# Patient Record
Sex: Female | Born: 1980 | Hispanic: Yes | Marital: Single | Smoking: Never smoker
Health system: Southern US, Community
[De-identification: ages and names within clinical notes are randomized; demographics above are authoritative.]

## PROBLEM LIST (undated history)

## (undated) ENCOUNTER — Inpatient Hospital Stay: Payer: Self-pay

## (undated) DIAGNOSIS — Z789 Other specified health status: Secondary | ICD-10-CM

## (undated) HISTORY — PX: NO PAST SURGERIES: SHX2092

---

## 2015-08-26 ENCOUNTER — Other Ambulatory Visit: Payer: Self-pay | Admitting: Family Medicine

## 2015-08-26 DIAGNOSIS — O200 Threatened abortion: Secondary | ICD-10-CM

## 2015-08-26 LAB — OB RESULTS CONSOLE HEPATITIS B SURFACE ANTIGEN: HEP B S AG: NEGATIVE

## 2015-08-26 LAB — OB RESULTS CONSOLE RPR: RPR: NONREACTIVE

## 2015-08-26 LAB — OB RESULTS CONSOLE HIV ANTIBODY (ROUTINE TESTING): HIV: NONREACTIVE

## 2015-08-28 ENCOUNTER — Ambulatory Visit
Admission: RE | Admit: 2015-08-28 | Discharge: 2015-08-28 | Disposition: A | Discharge status: Home / Self Care | Payer: Medicaid Other | Source: Ambulatory Visit | Attending: Family Medicine | Admitting: Family Medicine

## 2015-08-28 DIAGNOSIS — Z3A10 10 weeks gestation of pregnancy: Secondary | ICD-10-CM | POA: Insufficient documentation

## 2015-08-28 DIAGNOSIS — O200 Threatened abortion: Secondary | ICD-10-CM | POA: Insufficient documentation

## 2015-08-28 DIAGNOSIS — O208 Other hemorrhage in early pregnancy: Secondary | ICD-10-CM | POA: Insufficient documentation

## 2015-09-02 ENCOUNTER — Emergency Department: Payer: Medicaid Other

## 2015-09-02 ENCOUNTER — Emergency Department
Admission: EM | Admit: 2015-09-02 | Discharge: 2015-09-02 | Disposition: A | Discharge status: Home / Self Care | Payer: Medicaid Other | Attending: Emergency Medicine | Admitting: Emergency Medicine

## 2015-09-02 ENCOUNTER — Encounter: Payer: Self-pay | Admitting: Emergency Medicine

## 2015-09-02 DIAGNOSIS — Z3A11 11 weeks gestation of pregnancy: Secondary | ICD-10-CM | POA: Diagnosis not present

## 2015-09-02 DIAGNOSIS — Z87440 Personal history of urinary (tract) infections: Secondary | ICD-10-CM | POA: Insufficient documentation

## 2015-09-02 DIAGNOSIS — O209 Hemorrhage in early pregnancy, unspecified: Secondary | ICD-10-CM | POA: Insufficient documentation

## 2015-09-02 LAB — URINALYSIS COMPLETE WITH MICROSCOPIC (ARMC ONLY)
Bilirubin Urine: NEGATIVE
Glucose, UA: NEGATIVE mg/dL
LEUKOCYTES UA: NEGATIVE
Nitrite: NEGATIVE
PH: 7 (ref 5.0–8.0)
PROTEIN: NEGATIVE mg/dL
SPECIFIC GRAVITY, URINE: 1.004 — AB (ref 1.005–1.030)

## 2015-09-02 LAB — CBC WITH DIFFERENTIAL/PLATELET
BASOS ABS: 0 10*3/uL (ref 0–0.1)
BASOS PCT: 0 %
EOS PCT: 1 %
Eosinophils Absolute: 0.1 10*3/uL (ref 0–0.7)
HEMATOCRIT: 37.7 % (ref 35.0–47.0)
HEMOGLOBIN: 13.4 g/dL (ref 12.0–16.0)
Lymphocytes Relative: 21 %
Lymphs Abs: 1.7 10*3/uL (ref 1.0–3.6)
MCH: 30.8 pg (ref 26.0–34.0)
MCHC: 35.6 g/dL (ref 32.0–36.0)
MCV: 86.5 fL (ref 80.0–100.0)
MONO ABS: 0.5 10*3/uL (ref 0.2–0.9)
Monocytes Relative: 6 %
NEUTROS ABS: 5.8 10*3/uL (ref 1.4–6.5)
Neutrophils Relative %: 72 %
Platelets: 255 10*3/uL (ref 150–440)
RBC: 4.35 MIL/uL (ref 3.80–5.20)
RDW: 13.2 % (ref 11.5–14.5)
WBC: 8.2 10*3/uL (ref 3.6–11.0)

## 2015-09-02 LAB — BASIC METABOLIC PANEL
ANION GAP: 7 (ref 5–15)
BUN: 7 mg/dL (ref 6–20)
CALCIUM: 9 mg/dL (ref 8.9–10.3)
CO2: 23 mmol/L (ref 22–32)
Chloride: 105 mmol/L (ref 101–111)
Creatinine, Ser: 0.38 mg/dL — ABNORMAL LOW (ref 0.44–1.00)
GFR calc Af Amer: >60 mL/min (ref >60)
GLUCOSE: 90 mg/dL (ref 65–99)
POTASSIUM: 3.8 mmol/L (ref 3.5–5.1)
SODIUM: 135 mmol/L (ref 135–145)

## 2015-09-02 LAB — CHLAMYDIA/NGC RT PCR (ARMC ONLY)

## 2015-09-02 LAB — WET PREP, GENITAL
Clue Cells Wet Prep HPF POC: NONE SEEN
Sperm: NONE SEEN
TRICH WET PREP: NONE SEEN
YEAST WET PREP: NONE SEEN

## 2015-09-02 LAB — HCG, QUANTITATIVE, PREGNANCY: hCG, Beta Chain, Quant, S: 108900 m[IU]/mL — ABNORMAL HIGH (ref <5)

## 2015-09-02 LAB — ABO/RH: ABO/RH(D): A POS

## 2015-09-02 NOTE — ED Notes (Signed)
Son and daughter are sitting in room - pt is in [place]

## 2015-09-02 NOTE — ED Notes (Signed)
Pt to [place] at this time.

## 2015-09-02 NOTE — Discharge Instructions (Signed)
Return to the emergency department for any worsening bleeding, dizziness or passing out, worsening pain, or any other symptoms concerning to you.   Hemorragia vaginal durante el embarazo (primer trimestre) (Vaginal Bleeding During Pregnancy, First Trimester) Durante los primeros meses del embarazo es relativamente frecuente que se presente una pequea hemorragia (manchas). Esta situacin generalmente mejora por s misma. Estas hemorragias o manchas tienen diversas causas al inicio del embarazo. Algunas manchas pueden estar relacionadas al Big Lotsembarazo y otras no. En la International Business Machinesmayora de los casos, la hemorragia es normal y no es un problema. Sin embargo, la hemorragia tambin puede ser un signo de algo grave. Debe informar a su mdico de inmediato si tiene alguna hemorragia vaginal. Algunas causas posibles de hemorragia vaginal durante el primer trimestre incluyen:  Infeccin o inflamacin del cuello del tero.  Crecimientos anormales (plipos) en el cuello del tero.  Aborto espontneo o amenaza de aborto espontneo.  Tejido del Psychiatristembarazo se ha desarrollado fuera del tero y en una trompa de falopio (embarazo ectpico).  Se han desarrollado pequeos quistes en el tero en lugar de tejido de embarazo (embarazo molar). INSTRUCCIONES PARA EL CUIDADO EN EL HOGAR  Controle su afeccin para ver si hay cambios. Las siguientes indicaciones ayudarn a Psychologist, educationalaliviar cualquier Longs Drug Storesmolestia que pueda sentir:  Siga las indicaciones del mdico para restringir su actividad. Si el mdico le indica descanso en la cama, debe quedarse en la cama y levantarse solo para ir al bao. No obstante, el mdico puede permitirle que continu con tareas livianas.  Si es necesario, organcese para que alguien le ayude con las actividades y responsabilidades cotidianas mientras est en cama.  Lleve un registro de la cantidad y la saturacin de las toallas higinicas que Landscape architectutiliza cada da. Anote este dato.  No use tampones.No se haga duchas  vaginales.  No tenga relaciones sexuales u orgasmos hasta que el mdico la autorice.  Si elimina tejido por la vagina, gurdelo para mostrrselo al American Expressmdico.  [place]ome solo medicamentos de venta libre o recetados, segn las indicaciones del mdico.  No tome aspirina, ya que puede causar hemorragias.  Cumpla con todas las visitas de control, segn le indique su mdico. SOLICITE ATENCIN MDICA SI:  Tiene un sangrado vaginal en cualquier momento del embarazo.  Tiene calambres o dolores de [place] Valleyparto.  Tiene fiebre que los medicamentos no Sports coachpueden controlar. SOLICITE ATENCIN MDICA DE INMEDIATO SI:   Siente calambres intensos en la espalda o en el vientre (abdomen).  Elimina cogulos grandes o tejido por la vagina.  La hemorragia aumenta.  Si se siente mareada, dbil o se desmaya.  Tiene escalofros.  Tiene una prdida importante o sale lquido a borbotones por la vagina.  Se desmaya mientras defeca. ASEGRESE DE QUE:  Comprende estas instrucciones.  Controlar su afeccin.  Recibir ayuda de inmediato si no mejora o si empeora.   Esta informacin no tiene Theme park managercomo fin reemplazar el consejo del mdico. Asegrese de hacerle al mdico cualquier pregunta que tenga.   Document Released: 11/26/2004 Document Revised: 02/21/2013 Elsevier Interactive Patient Education [employer]2016 Elsevier Inc.

## 2015-09-02 NOTE — ED Provider Notes (Signed)
[facility] Emergency Department Provider Note   ____________________________________________  Time seen:  I have reviewed the triage vital signs and the triage nursing note.  HISTORY  Chief Complaint Vaginal Bleeding   Historian Patient through spanish interpreter Spouse speaks in english  HPI Autumn Hunter is a 35 y.o. female G2P1 who is approximately 10-[redacted] weeks pregnant and is here for evaluation of small amount of bright red blood on the toilet paper on Friday when they were out of town. She's had brownish mucus vaginal discharge over the last few days.  Some mild superpubic cramping today without specific dysuria. Several weeks ago she was treated for urinary tract infection. No fevers. No nausea. No vomiting.    History reviewed. No pertinent past medical history. Prior UTI  There are no active problems to display for this patient.   History reviewed. No pertinent past surgical history.  No current outpatient prescriptions on file.  Allergies Review of patient's allergies indicates no known allergies.  No family history on file.  Social History Social History  Substance Use Topics  . Smoking status: Never Smoker   . Smokeless tobacco: None  . Alcohol Use: No    Review of Systems  Constitutional: Negative for fever. Eyes: Negative for visual changes. ENT: Negative for sore throat. Cardiovascular: Negative for chest pain. Respiratory: Negative for shortness of breath. Gastrointestinal: Negative for  vomiting and diarrhea. Genitourinary: Negative for dysuria. Musculoskeletal: Negative for back pain. Skin: Negative for rash. Neurological: Negative for headache. 10 point Review of Systems otherwise negative ____________________________________________   PHYSICAL EXAM:  VITAL SIGNS: ED Triage Vitals  Enc Vitals Group     BP 09/02/15 1049 107/62 mmHg     Pulse Rate 09/02/15 1049 64     Resp 09/02/15 1049 20     Temp 09/02/15  1049 98.6 F (37 C)     Temp Source 09/02/15 1049 Oral     SpO2 09/02/15 1049 100 %     Weight 09/02/15 1049 137 lb (62.143 kg)     Height 09/02/15 1049 5\' 4"  (1.626 m)     Head Cir --      Peak Flow --      Pain Score 09/02/15 1049 5     Pain Loc --      Pain Edu? --      Excl. in GC? --      Constitutional: Alert and oriented. Well appearing and in no distress. HEENT   Head: Normocephalic and atraumatic.      Eyes: Conjunctivae are normal. PERRL. Normal extraocular movements.      Ears:         Nose: No congestion/rhinnorhea.   Mouth/Throat: Mucous membranes are moist.   Neck: No stridor. Cardiovascular/Chest: Normal rate, regular rhythm.  No murmurs, rubs, or gallops. Respiratory: Normal respiratory effort without tachypnea nor retractions. Breath sounds are clear and equal bilaterally. No wheezes/rales/rhonchi. Gastrointestinal: Soft. No distention, no guarding, no rebound. Nontender.   Genitourinary/rectal: Small amount of brownish discharge. No cervical motion tenderness. No bright red blood or clots. Musculoskeletal: Nontender with normal range of motion in all extremities. No joint effusions.  No lower extremity tenderness.  No edema. Neurologic:  Normal speech and language. No gross or focal neurologic deficits are appreciated. Skin:  Skin is warm, dry and intact. No rash noted. Psychiatric: Mood and affect are normal. Speech and behavior are normal. Patient exhibits appropriate insight and judgment.  ____________________________________________   EKG I, Governor Rooksebecca Maricia Scotti, MD, the attending  physician have personally viewed and interpreted all ECGs.  None ____________________________________________  LABS (pertinent positives/negatives)  Labs Reviewed  WET PREP, GENITAL - Abnormal; Notable for the following:    WBC, Wet Prep HPF POC FEW (*)    All other components within normal limits  CHLAMYDIA/NGC RT PCR ([facility] ONLY) - Abnormal; Notable for the  following:    Chlamydia Tr   (*)    Value: INVALID, UNABLE TO DETERMINE THE PRESENCE OF TARGET DNA DUE TO SPECIMEN INTEGRITY. RECOLLECTION REQUESTED.   N gonorrhoeae   (*)    Value: INVALID, UNABLE TO DETERMINE THE PRESENCE OF TARGET DNA DUE TO SPECIMEN INTEGRITY. RECOLLECTION REQUESTED.   All other components within normal limits  BASIC METABOLIC PANEL - Abnormal; Notable for the following:    Creatinine, Ser 0.38 (*)    All other components within normal limits  HCG, QUANTITATIVE, PREGNANCY - Abnormal; Notable for the following:    hCG, Beta Chain, Quant, S [id number] (*)    All other components within normal limits  URINALYSIS COMPLETEWITH MICROSCOPIC ([facility] ONLY) - Abnormal; Notable for the following:    Color, Urine COLORLESS (*)    APPearance CLEAR (*)    Ketones, ur TRACE (*)    Specific Gravity, Urine 1.004 (*)    Hgb urine dipstick 1+ (*)    Bacteria, UA RARE (*)    Squamous Epithelial / LPF 0-5 (*)    All other components within normal limits  CBC WITH DIFFERENTIAL/PLATELET  ABO/RH    ____________________________________________  RADIOLOGY All Xrays were viewed by me. Imaging interpreted by Radiologist.  Ultrasound:   IMPRESSION: Eleven week 1 day intrauterine pregnancy. Fetal heart rate 169 beats per minute. Small subchorionic hemorrhage. __________________________________________  PROCEDURES  Procedure(s) performed: None  Critical Care performed: None  ____________________________________________   ED COURSE / ASSESSMENT AND PLAN  Pertinent labs & imaging results that were available during my care of the patient were reviewed by me and considered in my medical decision making (see chart for details).   Patient's here with first trimester bleeding. Ultrasound shows 11 week IUP with small subcortical hemorrhage.  During the history, I did not elicit the fact the patient had a recent ultrasound, but upon chart review I did find a prior ultrasound that showed  10 week IUP without subchorionic hemorrhage.  In either case, laboratory studies and urinalysis are not consistent with UTI or other high risk for intra-abdominal problem.  I did describe the ultrasound is reassuring at this point time in terms of fetal activity, but that there is a small subchorionic hemorrhage which is nonspecific at this point time. Family was given instructions in terms of follow-up, return precautions for first trimester bleeding.    CONSULTATIONS:   None   Patient / Family / Caregiver informed of clinical course, medical decision-making process, and agree with plan.   I discussed return precautions, follow-up instructions, and discharged instructions with patient and/or family.   ___________________________________________   FINAL CLINICAL IMPRESSION(S) / ED DIAGNOSES   Final diagnoses:  First trimester bleeding              Note: This dictation was prepared with Dragon dictation. Any transcriptional errors that result from this process are unintentional   Governor Rooksebecca Maysun Meditz, MD 09/02/15 2114

## 2015-09-02 NOTE — ED Notes (Addendum)
Pt is [redacted]weeks pregnant.  Pt with small amount of vaginal bleeding 3 days ago with wiping.  Now with on/off brownish discharge for the last 2 days. Pt reports some pressure in lower abdomen at times.  Has history of premature birth with first pregnancy.

## 2015-09-23 ENCOUNTER — Other Ambulatory Visit: Payer: Self-pay | Admitting: Family Medicine

## 2015-09-23 DIAGNOSIS — Z3402 Encounter for supervision of normal first pregnancy, second trimester: Secondary | ICD-10-CM

## 2015-10-24 ENCOUNTER — Ambulatory Visit
Admission: RE | Admit: 2015-10-24 | Discharge: 2015-10-24 | Disposition: A | Discharge status: Home / Self Care | Payer: Self-pay | Source: Ambulatory Visit | Attending: Family Medicine | Admitting: Family Medicine

## 2015-10-24 DIAGNOSIS — Z3402 Encounter for supervision of normal first pregnancy, second trimester: Secondary | ICD-10-CM

## 2015-10-24 DIAGNOSIS — Z3A18 18 weeks gestation of pregnancy: Secondary | ICD-10-CM | POA: Insufficient documentation

## 2015-10-24 DIAGNOSIS — Z3482 Encounter for supervision of other normal pregnancy, second trimester: Secondary | ICD-10-CM | POA: Insufficient documentation

## 2016-02-13 ENCOUNTER — Other Ambulatory Visit: Payer: Self-pay | Admitting: Advanced Practice Midwife

## 2016-02-13 DIAGNOSIS — Z3483 Encounter for supervision of other normal pregnancy, third trimester: Secondary | ICD-10-CM

## 2016-02-17 ENCOUNTER — Other Ambulatory Visit: Payer: Self-pay | Admitting: Advanced Practice Midwife

## 2016-02-17 ENCOUNTER — Ambulatory Visit
Admission: RE | Admit: 2016-02-17 | Discharge: 2016-02-17 | Disposition: A | Discharge status: Home / Self Care | Payer: Self-pay | Source: Ambulatory Visit | Attending: Advanced Practice Midwife | Admitting: Advanced Practice Midwife

## 2016-02-17 DIAGNOSIS — O26843 Uterine size-date discrepancy, third trimester: Secondary | ICD-10-CM

## 2016-02-17 DIAGNOSIS — Z3483 Encounter for supervision of other normal pregnancy, third trimester: Secondary | ICD-10-CM | POA: Insufficient documentation

## 2016-02-17 DIAGNOSIS — Z3A35 35 weeks gestation of pregnancy: Secondary | ICD-10-CM | POA: Insufficient documentation

## 2016-02-29 LAB — OB RESULTS CONSOLE GBS: GBS: NEGATIVE

## 2016-03-02 NOTE — L&D Delivery Note (Signed)
Delivery Note  First Stage: Labor onset: 03/11/16 @ 1800 Augmentation : AROM, Pitocin  Analgesia /Anesthesia intrapartum: none AROM at 0150 for clear fluid  Second Stage: Complete dilation at 0418 Onset of pushing at 0418 FHR second stage : Category 2: baseline: 150 bpm/ moderate variability/ +accels/ variable decels with pushing to a nadir of 90 bpm with good return to baseline  Delivery of a viable female "Autumn Hunter" at [id number] by Carlean JewsMeredith Sigmon, CNM in ROA position with a compound left arm position.  She pushed well over an intact perineum, and fetal head was delivered atraumatically with the anterior (left) hand followed by arm and shoulder was delivered.  The posterior and body quickly followed.  Baby was immediately vigorous and was dried and stimulated on mom's abdomen.  No nuchal cord was present. Cord double clamped after cessation of pulsation, cut by FOB. Third stage: Placenta delivered via schultz intact with 3 VC @ [id number].  She had 2 large gushes of blood prior to placental detachment.  2 large clots expressed.  Uterine tone firm with massage and IV Pitocin 500mL bolus infusing and bleeding slowed..  No other medications required.  Placenta disposition: hospital disposal.    1st degree laceration with one stellate vaginal laceration Anesthesia for repair: 1% Lidocaine 30mL Repair 2.0 vicryl  Est. Blood Loss (mL): 500mL  Complications: Postpartum hemorrhage likely due to uterine atony and laceration that resolved with IV Pitocin and repair and manual expression of large clots.   Mom to postpartum.  Baby to Couplet care / Skin to Skin.  Newborn: Birth Weight: 3970 gram (8#12oz) Apgar Scores: 8, 9 Feeding planned: Breast and Formula  Carlean JewsMeredith Sigmon, CNM

## 2016-03-06 ENCOUNTER — Inpatient Hospital Stay
Admission: EM | Admit: 2016-03-06 | Discharge: 2016-03-06 | Disposition: A | Discharge status: Hospice | Payer: Medicaid Other | Attending: Obstetrics and Gynecology | Admitting: Obstetrics and Gynecology

## 2016-03-06 ENCOUNTER — Encounter: Payer: Self-pay | Admitting: *Deleted

## 2016-03-06 DIAGNOSIS — O368130 Decreased fetal movements, third trimester, not applicable or unspecified: Secondary | ICD-10-CM | POA: Insufficient documentation

## 2016-03-06 DIAGNOSIS — Z3A37 37 weeks gestation of pregnancy: Secondary | ICD-10-CM | POA: Insufficient documentation

## 2016-03-06 HISTORY — DX: Other specified health status: Z78.9

## 2016-03-06 NOTE — Discharge Instructions (Signed)
Evaluación de los movimientos fetales   (Fetal Movement Counts)  Nombre del paciente: __________________________________________________ Fecha de parto estimada: ____________________  La evaluación de los movimientos fetales es muy recomendable en los embarazos de alto riesgo, pero también es una buena idea que lo hagan todas las embarazadas. El médico le indicará que comience a contarlos a las 28 semanas de embarazo. Los movimientos fetales suelen aumentar:   · Después de una comida completa.  · Después de la actividad física.  · Después de comer o beber algo dulce o frío.  · En reposo.  Preste atención cuando sienta que el bebé está más activo. Esto le ayudará a notar un patrón de ciclos de vigilia y sueño de su bebé y cuáles son los factores que contribuyen a un aumento de los movimientos fetales. Es importante llevar a cabo un recuento de movimientos fetales, al mismo tiempo cada día, cuando el bebé normalmente está más activo.   CÓMO CONTAR LOS MOVIMIENTOS FETALES  1. Busque un lugar tranquilo y cómodo para sentarse o recostarse sobre el lado izquierdo. Al recostarse sobre su lado izquierdo, le proporciona una mejor circulación de sangre y oxígeno al bebé.  2. Anote el día y la hora en una hoja de papel o en un diario.  3. Comience contando las pataditas, revoloteos, chasquidos, vueltas o pinchazos en un período de 2 horas. Debe sentir al menos 10 movimientos en 2 horas.  4. Si no siente 10 movimientos en 2 horas, espere 2 ó 3 horas y cuente de nuevo. Busque cambios en el patrón o si no cuenta lo suficiente en 2 horas.  SOLICITE ATENCIÓN MÉDICA SI:   · Siente menos de 10 pataditas en 2 horas, en dos intentos.  · No hay movimientos durante una hora.  · El patrón se modifica o le lleva más tiempo cada día contar las 10 pataditas.  · Siente que el bebé no se mueve como lo hace habitualmente.  Fecha: ____________ Movimientos: ____________ Hora de inicio: ____________ Hora de finalización: ____________   Fecha:  ____________ Movimientos: ____________ Hora de inicio: ____________ Hora de finalización: ____________   Fecha: ____________ Movimientos: ____________ Hora de inicio: ____________ Hora de finalización: ____________   Fecha: ____________ Movimientos: ____________ Hora de inicio: ____________ Hora de finalización: ____________   Fecha: ____________ Movimientos: ____________ Hora de inicio: ____________ Hora de finalización: ____________   Fecha: ____________ Movimientos: ____________ Hora de inicio: ____________ Hora de finalización: ____________   Fecha: ____________ Movimientos: ____________ Hora de inicio: ____________ Hora de finalización: ____________   Fecha: ____________ Movimientos: ____________ Hora de inicio: ____________ Hora de finalización: ____________   Fecha: ____________ Movimientos: ____________ Hora de inicio: ____________ Hora de finalización: ____________   Fecha: ____________ Movimientos: ____________ Hora de inicio: ____________ Hora de finalización: ____________   Fecha: ____________ Movimientos: ____________ Hora de inicio: ____________ Hora de finalización: ____________   Fecha: ____________ Movimientos: ____________ Hora de inicio: ____________ Hora de finalización: ____________   Fecha: ____________ Movimientos: ____________ Hora de inicio: ____________ Hora de finalización: ____________   Fecha: ____________ Movimientos: ____________ Hora de inicio: ____________ Hora de finalización: ____________   Fecha: ____________ Movimientos: ____________ Hora de inicio: ____________ Hora de finalización: ____________   Fecha: ____________ Movimientos: ____________ Hora de inicio: ____________ Hora de finalización: ____________   Fecha: ____________ Movimientos: ____________ Hora de inicio: ____________ Hora de finalización: ____________   Fecha: ____________ Movimientos: ____________ Hora de inicio: ____________ Hora de finalización: ____________   Fecha: ____________ Movimientos: ____________ Hora  de inicio: ____________ Hora de finalización: ____________     Fecha: ____________ Movimientos: ____________ Hora de inicio: ____________ Hora de finalización: ____________   Fecha: ____________ Movimientos: ____________ Hora de inicio: ____________ Hora de finalización: ____________   Fecha: ____________ Movimientos: ____________ Hora de inicio: ____________ Hora de finalización: ____________   Fecha: ____________ Movimientos: ____________ Hora de inicio: ____________ Hora de finalización: ____________   Fecha: ____________ Movimientos: ____________ Hora de inicio: ____________ Hora de finalización: ____________   Fecha: ____________ Movimientos: ____________ Hora de inicio: ____________ Hora de finalización: ____________   Fecha: ____________ Movimientos: ____________ Hora de inicio: ____________ Hora de finalización: ____________   Fecha: ____________ Movimientos: ____________ Hora de inicio: ____________ Hora de finalización: ____________   Fecha: ____________ Movimientos: ____________ Hora de inicio: ____________ Hora de finalización: ____________   Fecha: ____________ Movimientos: ____________ Hora de inicio: ____________ Hora de finalización: ____________   Fecha: ____________ Movimientos: ____________ Hora de inicio: ____________ Hora de finalización: ____________   Fecha: ____________ Movimientos: ____________ Hora de inicio: ____________ Hora de finalización: ____________   Fecha: ____________ Movimientos: ____________ Hora de inicio: ____________ Hora de finalización: ____________   Fecha: ____________ Movimientos: ____________ Hora de inicio: ____________ Hora de finalización: ____________   Fecha: ____________ Movimientos: ____________ Hora de inicio: ____________ Hora de finalización: ____________   Fecha: ____________ Movimientos: ____________ Hora de inicio: ____________ Hora de finalización: ____________   Fecha: ____________ Movimientos: ____________ Hora de inicio: ____________ Hora de finalización:  ____________   Fecha: ____________ Movimientos: ____________ Hora de inicio: ____________ Hora de finalización: ____________   Fecha: ____________ Movimientos: ____________ Hora de inicio: ____________ Hora de finalización: ____________   Fecha: ____________ Movimientos: ____________ Hora de inicio: ____________ Hora de finalización: ____________   Fecha: ____________ Movimientos: ____________ Hora de inicio: ____________ Hora de finalización: ____________   Fecha: ____________ Movimientos: ____________ Hora de inicio: ____________ Hora de finalización: ____________   Fecha: ____________ Movimientos: ____________ Hora de inicio: ____________ Hora de finalización: ____________   Fecha: ____________ Movimientos: ____________ Hora de inicio: ____________ Hora de finalización: ____________   Fecha: ____________ Movimientos: ____________ Hora de inicio: ____________ Hora de finalización: ____________   Fecha: ____________ Movimientos: ____________ Hora de inicio: ____________ Hora de finalización: ____________   Fecha: ____________ Movimientos: ____________ Hora de inicio: ____________ Hora de finalización: ____________   Fecha: ____________ Movimientos: ____________ Hora de inicio: ____________ Hora de finalización: ____________   Fecha: ____________ Movimientos: ____________ Hora de inicio: ____________ Hora de finalización: ____________   Fecha: ____________ Movimientos: ____________ Hora de inicio: ____________ Hora de finalización: ____________   Fecha: ____________ Movimientos: ____________ Hora de inicio: ____________ Hora de finalización: ____________   Fecha: ____________ Movimientos: ____________ Hora de inicio: ____________ Hora de finalización: ____________   Fecha: ____________ Movimientos: ____________ Hora de inicio: ____________ Hora de finalización: ____________   Fecha: ____________ Movimientos: ____________ Hora de inicio: ____________ Hora de finalización: ____________   Fecha: ____________  Movimientos: ____________ Hora de inicio: ____________ Hora de finalización: ____________   Fecha: ____________ Movimientos: ____________ Hora de inicio: ____________ Hora de finalización: ____________   Fecha: ____________ Movimientos: ____________ Hora de inicio: ____________ Hora de finalización: ____________   Esta información no tiene como fin reemplazar el consejo del médico. Asegúrese de hacerle al médico cualquier pregunta que tenga.  Document Released: 05/26/2007 Document Revised: 02/03/2012  Elsevier Interactive Patient Education © 2017 Elsevier Inc.

## 2016-03-06 NOTE — OB Triage Note (Signed)
G2P1 presents at [id number] for NST for decreased fetal movement.

## 2016-03-06 NOTE — Discharge Summary (Signed)
Obstetric Discharge Summary Reason for Admission: decreased fetal movement Prenatal Procedures: NST Intrapartum Procedures: none Postpartum Procedures: none Complications-Operative and Postpartum: none Hemoglobin  Date Value Ref Range Status  09/02/2015 13.4 12.0 - 16.0 g/dL Final   HCT  Date Value Ref Range Status  09/02/2015 37.7 35.0 - 47.0 % Final    Physical Exam:  General: alert and cooperative NST reactive with 2 accels 15 x 15 BPM, no decels, Cat 1 UC: q 8 mins Discharge Diagnoses: IUP at 37 weeks with decreased fetal movement  Discharge Information: Date: 03/06/2016 Activity: unrestricted Diet: regular Medications: PNV Condition: stable Instructions: Fetal kick counts daily  Discharge to: home   Newborn Data: This patient has no babies on file.   Sharee Pimplearon W Jones 03/06/2016, 6:07 PM

## 2016-03-11 ENCOUNTER — Inpatient Hospital Stay
Admission: EM | Admit: 2016-03-11 | Discharge: 2016-03-13 | DRG: 774 | Disposition: A | Discharge status: Home / Self Care | Payer: Medicaid Other | Attending: Obstetrics and Gynecology | Admitting: Obstetrics and Gynecology

## 2016-03-11 DIAGNOSIS — Z833 Family history of diabetes mellitus: Secondary | ICD-10-CM

## 2016-03-11 DIAGNOSIS — O326XX0 Maternal care for compound presentation, not applicable or unspecified: Secondary | ICD-10-CM | POA: Diagnosis present

## 2016-03-11 DIAGNOSIS — O9081 Anemia of the puerperium: Secondary | ICD-10-CM | POA: Diagnosis not present

## 2016-03-11 DIAGNOSIS — Z3A38 38 weeks gestation of pregnancy: Secondary | ICD-10-CM

## 2016-03-11 DIAGNOSIS — Z3493 Encounter for supervision of normal pregnancy, unspecified, third trimester: Secondary | ICD-10-CM | POA: Diagnosis present

## 2016-03-11 DIAGNOSIS — D62 Acute posthemorrhagic anemia: Secondary | ICD-10-CM | POA: Diagnosis not present

## 2016-03-11 LAB — CBC
HEMATOCRIT: 40.2 % (ref 35.0–47.0)
HEMOGLOBIN: 13.7 g/dL (ref 12.0–16.0)
MCH: 31.2 pg (ref 26.0–34.0)
MCHC: 34.2 g/dL (ref 32.0–36.0)
MCV: 91.4 fL (ref 80.0–100.0)
Platelets: 212 10*3/uL (ref 150–440)
RBC: 4.4 MIL/uL (ref 3.80–5.20)
RDW: 14.1 % (ref 11.5–14.5)
WBC: 11.3 10*3/uL — ABNORMAL HIGH (ref 3.6–11.0)

## 2016-03-11 LAB — TYPE AND SCREEN
ABO/RH(D): A POS
Antibody Screen: NEGATIVE

## 2016-03-11 MED ORDER — OXYTOCIN 40 UNITS IN LACTATED RINGERS INFUSION - SIMPLE MED
INTRAVENOUS | Status: AC
  Administered 2016-03-12: 2 m[IU]/min via INTRAVENOUS
  Filled 2016-03-11: qty 1000

## 2016-03-11 MED ORDER — OXYTOCIN 40 UNITS IN LACTATED RINGERS INFUSION - SIMPLE MED
2.5000 [IU]/h | INTRAVENOUS | Status: DC
  Filled 2016-03-11: qty 1000

## 2016-03-11 MED ORDER — ONDANSETRON HCL 4 MG/2ML IJ SOLN: 4.0000 mg | Freq: Four times a day (QID) | INTRAMUSCULAR | Status: DC | PRN

## 2016-03-11 MED ORDER — LACTATED RINGERS IV SOLN
INTRAVENOUS | Status: DC
  Administered 2016-03-11 – 2016-03-12 (×2): via INTRAVENOUS

## 2016-03-11 MED ORDER — LACTATED RINGERS IV SOLN: 500.0000 mL | INTRAVENOUS | Status: DC | PRN

## 2016-03-11 MED ORDER — AMMONIA AROMATIC IN INHA
RESPIRATORY_TRACT | Status: AC
  Filled 2016-03-11: qty 10

## 2016-03-11 MED ORDER — OXYTOCIN BOLUS FROM INFUSION
500.0000 mL | Freq: Once | INTRAVENOUS | Status: AC
  Administered 2016-03-12: 500 mL via INTRAVENOUS

## 2016-03-11 MED ORDER — OXYTOCIN 10 UNIT/ML IJ SOLN
INTRAMUSCULAR | Status: AC
  Filled 2016-03-11: qty 2

## 2016-03-11 MED ORDER — LIDOCAINE HCL (PF) 1 % IJ SOLN
30.0000 mL | INTRAMUSCULAR | Status: DC | PRN
Start: 2016-03-11 — End: 2016-03-13
  Administered 2016-03-12: 10 mL via SUBCUTANEOUS

## 2016-03-11 MED ORDER — MISOPROSTOL 200 MCG PO TABS
ORAL_TABLET | ORAL | Status: AC
  Filled 2016-03-11: qty 4

## 2016-03-11 MED ORDER — ACETAMINOPHEN 325 MG PO TABS: 650.0000 mg | ORAL_TABLET | ORAL | Status: DC | PRN

## 2016-03-11 MED ORDER — BUTORPHANOL TARTRATE 1 MG/ML IJ SOLN: 1.0000 mg | INTRAMUSCULAR | Status: DC | PRN

## 2016-03-11 MED ORDER — SOD CITRATE-CITRIC ACID 500-334 MG/5ML PO SOLN: 30.0000 mL | ORAL | Status: DC | PRN

## 2016-03-11 MED ORDER — LIDOCAINE HCL (PF) 1 % IJ SOLN
INTRAMUSCULAR | Status: AC
  Administered 2016-03-12: 10 mL via SUBCUTANEOUS
  Filled 2016-03-11: qty 30

## 2016-03-11 NOTE — H&P (Addendum)
  OB ADMISSION/ HISTORY & PHYSICAL:  Admission Date: 03/11/2016  6:53 PM  Admit Diagnosis: Labor at 38+3 weeks   Autumn Hunter is a 36 y.o. female G3P1 at 38+3 weeks presenting for labor.  She reports low back pain all day today, and states painful contractions started around 6:00pm.    Prenatal History: G3P1   EDC : 03/22/2016, by Last Menstrual Period 06/16/15 Prenatal care at [facility] Prenatal course complicated by subchorionic hemorrhage in 1st trimester - resolved   Prenatal Labs: ABO, Rh: A Positive  Antibody: Negative  Rubella:   Immune Varicella: Immune RPR:   NR HBsAg:   Negative  HIV:   Negative GTT: 127 GBS: Negative (12/30 0900)  PPD: Negative   Medical / Surgical History :  Past medical history:  Past Medical History:  Diagnosis Date  . Medical history non-contributory      Past surgical history:  Past Surgical History:  Procedure Laterality Date  . NO PAST SURGERIES      Family History:  Family History  Problem Relation Age of Onset  . Cancer Mother   . Diabetes Father      Social History:  reports that she has never smoked. She has never used smokeless tobacco. She reports that she does not drink alcohol or use drugs.   Allergies: Patient has no known allergies.    Current Medications at time of admission:  Prior to Admission medications   Medication Sig Start Date End Date Taking? Authorizing Provider  Prenatal Vit-Fe Fumarate-FA (PRENATAL MULTIVITAMIN) TABS tablet Take 1 tablet by mouth daily at 12 noon.   Yes Historical Provider, MD     Review of Systems: Active FM onset of ctx @ 1800 currently every 1-3 minutes No LOF  / SROM  bloody show present    Physical Exam:  VS: Blood pressure 136/81, pulse 85, temperature 98.4 F (36.9 C), temperature source Oral, resp. rate 18, weight 81.6 kg (180 lb), last menstrual period 06/16/2015.  General: alert and oriented, appears calm  Heart: RRR Lungs: Clear lung fields Abdomen: Gravid, tight,  and non-tender, non-distended / uterus: gravid, non-tender Extremities: no edema  Genitalia / VE: Dilation: 5.5 Effacement (%): 80 Station: -1 Exam by:: Merdeith S., CNM  FHR: baseline rate 150 bpm / variability moderate / accelerations + / no decelerations TOCO: 1-3 minutes   Assessment: 38+[redacted] weeks gestation Active stage of labor FHR category 1   Plan:  1. Admit to Labor and Delivery     - Routine labor and delivery orders    - Stadol PRN for pain    - May have epidural if desires 2. GBS Negative    - No prophylaxis indicated 3. Postpartum     - Breast/Formula     - Contraception: considering OCPs 4. Anticipate NSVD     - Proven pelvis: 5#12oz  Dr. Jean RosenthalJackson notified of admission / plan of care  Carlean JewsMeredith Mikah Rottinghaus, CNM

## 2016-03-12 DIAGNOSIS — D62 Acute posthemorrhagic anemia: Secondary | ICD-10-CM | POA: Diagnosis not present

## 2016-03-12 LAB — CBC
HEMATOCRIT: 34 % — AB (ref 35.0–47.0)
Hemoglobin: 11.8 g/dL — ABNORMAL LOW (ref 12.0–16.0)
MCH: 31.5 pg (ref 26.0–34.0)
MCHC: 34.8 g/dL (ref 32.0–36.0)
MCV: 90.4 fL (ref 80.0–100.0)
PLATELETS: 202 10*3/uL (ref 150–440)
RBC: 3.76 MIL/uL — ABNORMAL LOW (ref 3.80–5.20)
RDW: 14.1 % (ref 11.5–14.5)
WBC: 16.7 10*3/uL — AB (ref 3.6–11.0)

## 2016-03-12 MED ORDER — SODIUM CHLORIDE FLUSH 0.9 % IV SOLN
INTRAVENOUS | Status: AC
  Filled 2016-03-12: qty 10

## 2016-03-12 MED ORDER — OXYTOCIN 40 UNITS IN LACTATED RINGERS INFUSION - SIMPLE MED
1.0000 m[IU]/min | INTRAVENOUS | Status: DC
  Administered 2016-03-12: 2 m[IU]/min via INTRAVENOUS
  Administered 2016-03-12: 1 m[IU]/min via INTRAVENOUS

## 2016-03-12 MED ORDER — DIBUCAINE 1 % RE OINT: 1.0000 "application " | TOPICAL_OINTMENT | RECTAL | Status: DC | PRN

## 2016-03-12 MED ORDER — WITCH HAZEL-GLYCERIN EX PADS: 1.0000 "application " | MEDICATED_PAD | CUTANEOUS | Status: DC | PRN

## 2016-03-12 MED ORDER — BENZOCAINE-MENTHOL 20-0.5 % EX AERO
1.0000 "application " | INHALATION_SPRAY | Freq: Three times a day (TID) | CUTANEOUS | Status: DC
  Administered 2016-03-12 – 2016-03-13 (×3): 1 via TOPICAL

## 2016-03-12 MED ORDER — BENZOCAINE-MENTHOL 20-0.5 % EX AERO
INHALATION_SPRAY | CUTANEOUS | Status: AC
  Filled 2016-03-12: qty 56

## 2016-03-12 MED ORDER — OXYCODONE-ACETAMINOPHEN 5-325 MG PO TABS
1.0000 | ORAL_TABLET | ORAL | Status: DC | PRN
  Administered 2016-03-12 (×2): 1 via ORAL
  Filled 2016-03-12 (×3): qty 1

## 2016-03-12 MED ORDER — ONDANSETRON HCL 4 MG PO TABS: 4.0000 mg | ORAL_TABLET | ORAL | Status: DC | PRN

## 2016-03-12 MED ORDER — OXYCODONE-ACETAMINOPHEN 5-325 MG PO TABS: 2.0000 | ORAL_TABLET | ORAL | Status: DC | PRN

## 2016-03-12 MED ORDER — COCONUT OIL OIL
1.0000 | TOPICAL_OIL | Status: DC | PRN
Start: 2016-03-12 — End: 2016-03-13

## 2016-03-12 MED ORDER — AMMONIA AROMATIC IN INHA
RESPIRATORY_TRACT | Status: AC
  Filled 2016-03-12: qty 10

## 2016-03-12 MED ORDER — DIPHENHYDRAMINE HCL 25 MG PO CAPS: 25.0000 mg | ORAL_CAPSULE | Freq: Four times a day (QID) | ORAL | Status: DC | PRN

## 2016-03-12 MED ORDER — ACETAMINOPHEN 325 MG PO TABS: 650.0000 mg | ORAL_TABLET | ORAL | Status: DC | PRN

## 2016-03-12 MED ORDER — ZOLPIDEM TARTRATE 5 MG PO TABS: 5.0000 mg | ORAL_TABLET | Freq: Every evening | ORAL | Status: DC | PRN

## 2016-03-12 MED ORDER — SENNOSIDES-DOCUSATE SODIUM 8.6-50 MG PO TABS
2.0000 | ORAL_TABLET | ORAL | Status: DC
  Administered 2016-03-13: 2 via ORAL
  Filled 2016-03-12: qty 2

## 2016-03-12 MED ORDER — IBUPROFEN 600 MG PO TABS
600.0000 mg | ORAL_TABLET | Freq: Four times a day (QID) | ORAL | Status: DC
  Administered 2016-03-12 – 2016-03-13 (×5): 600 mg via ORAL
  Filled 2016-03-12 (×5): qty 1

## 2016-03-12 MED ORDER — TERBUTALINE SULFATE 1 MG/ML IJ SOLN: 0.2500 mg | Freq: Once | INTRAMUSCULAR | Status: DC | PRN

## 2016-03-12 MED ORDER — SIMETHICONE 80 MG PO CHEW: 80.0000 mg | CHEWABLE_TABLET | ORAL | Status: DC | PRN

## 2016-03-12 MED ORDER — FENTANYL CITRATE (PF) 100 MCG/2ML IJ SOLN
INTRAMUSCULAR | Status: AC
  Administered 2016-03-12: 50 ug via INTRAVENOUS
  Filled 2016-03-12: qty 2

## 2016-03-12 MED ORDER — FENTANYL CITRATE (PF) 100 MCG/2ML IJ SOLN
50.0000 ug | Freq: Once | INTRAMUSCULAR | Status: AC
  Administered 2016-03-12: 50 ug via INTRAVENOUS

## 2016-03-12 MED ORDER — PRENATAL MULTIVITAMIN CH
1.0000 | ORAL_TABLET | Freq: Every day | ORAL | Status: DC
  Administered 2016-03-12 – 2016-03-13 (×2): 1 via ORAL
  Filled 2016-03-12 (×2): qty 1

## 2016-03-12 MED ORDER — ONDANSETRON HCL 4 MG/2ML IJ SOLN: 4.0000 mg | INTRAMUSCULAR | Status: DC | PRN

## 2016-03-12 MED ORDER — FERROUS SULFATE 325 (65 FE) MG PO TABS
325.0000 mg | ORAL_TABLET | Freq: Two times a day (BID) | ORAL | Status: DC
  Administered 2016-03-12 – 2016-03-13 (×2): 325 mg via ORAL
  Filled 2016-03-12 (×2): qty 1

## 2016-03-12 NOTE — Discharge Summary (Signed)
Obstetrical Discharge Summary  Patient Name: Autumn Hunter DOB: October 23, 1980 MRN: [id number]  Date of Admission: 03/11/2016 Date of Discharge: 03/13/16 Primary OB: [facility]   Gestational Age at Delivery: [id number]   Antepartum complications: subchorionic hemorrhage in 1st trimester - resolved, uterine size greater than dates Admitting Diagnosis: Active Labor at Term  Secondary Diagnosis: Patient Active Problem List   Diagnosis Date Noted  . Postpartum care following vaginal delivery 03/12/2016  . Acute blood loss anemia 03/12/2016  . Postpartum hemorrhage, third stage, delivered 03/12/2016  . Labor and delivery, indication for care 03/11/2016    Augmentation: AROM and Pitocin Complications: Postpartum hemorrhage EBL 500mL  Intrapartum complications/course: Pt. Arrived contracting every 1-3 minutes and found to be 5cm.  She slowly progressed requiring AROM and a small dose of Pitocin.  Pitocin was discontinued due to fetal deceleration.  Delivery of a viable female "Daphine DeutscherMartin" at [phone] by Carlean JewsMeredith Sigmon, CNM in ROA position with a compound left arm position.  She pushed well over an intact perineum, and fetal head was delivered atraumatically with the anterior (left) hand followed by arm and shoulder was delivered.  The posterior and body quickly followed.  Baby was immediately vigorous and was dried and stimulated on mom's abdomen.  No nuchal cord was present. Cord double clamped after cessation of pulsation, cut by FOB. Third stage: Placenta delivered via schultz intact with 3 VC @ [id number].  She had 2 large gushes of blood prior to placental detachment.  2 large clots expressed.  Uterine tone firm with massage and IV Pitocin 500mL bolus infusing and bleeding slowed..  No other medications required.  Placenta disposition: hospital disposal.   Date of Delivery: 03/12/16 Delivered By: Carlean JewsMeredith Sigmon, CNM  Delivery Type: spontaneous vaginal delivery Anesthesia: epidural Placenta: sponatneous Laceration:  1st degree perineal with a stellate vaginal lac Episiotomy: none Newborn Data: Live born female  Birth Weight: 8 lb 12 oz (3970 g) APGAR: 8, 9  Postpartum Procedures: Oral FE  Post partum course: uncomplicated  Patient had an uncomplicated postpartum course.  By time of discharge on PPD#1, her pain was controlled on oral pain medications; she had appropriate lochia and was ambulating, voiding without difficulty and tolerating regular diet.  She was deemed stable for discharge to home.     Discharge Physical Exam:  BP 124/83   Pulse 86   Temp 98.3 F (36.8 C) (Oral)   Resp 18   Wt 81.6 kg (180 lb)   LMP 06/16/2015 (Exact Date)   BMI 30.90 kg/m   General: NAD CV: RRR Pulm: CTABL, nl effort ABD: s/nd/nt, fundus firm and below the umbilicus Lochia: moderate Incision: c/d/i DVT Evaluation: LE non-ttp, no evidence of DVT on exam.  Hemoglobin  Date Value Ref Range Status  03/11/2016 13.7 12.0 - 16.0 g/dL Final   HCT  Date Value Ref Range Status  03/11/2016 40.2 35.0 - 47.0 % Final   POD#1 hct 31.4%  Disposition: stable, discharge to home. Baby Feeding: breastmilk and formula Baby Disposition: home with mom  Rh Immune globulin given: N/A Rubella vaccine given: N/A Tdap vaccine given in AP or PP setting: UTD Flu vaccine given in AP or PP setting: UTD  Contraception: OCPs   Prenatal Labs:  ABO, Rh: A Positive  Antibody: Negative  Rubella:   Immune Varicella: Immune RPR:   NR HBsAg:   Negative  HIV:   Negative GTT: 127 GBS: Negative (12/30 0900)  PPD: Negative    Plan:  Autumn Hunter was discharged to home in  good condition. Follow-up appointment at [facility] in 6 weeks    Discharge Medications: Motrin  dermablast  micronor     Signed: Ihor Austin Alleah Dearman MD

## 2016-03-12 NOTE — Progress Notes (Signed)
S:  Pt. Has made little cervical change over the past 6 hours, her contractions seem to have spaced out.  I discussed the option of AROM with her and husband and they both agree  O:  VS: Blood pressure (!) 142/81, pulse 87, temperature 98.5 F (36.9 C), temperature source Oral, resp. rate 18, weight 81.6 kg (180 lb), last menstrual period 06/16/2015.        FHR : baseline 150 bpm/ variability moderate / accelerations + / no decelerations        Toco: contractions every 3-5 minutes / moderate         Cervix : Dilation: 6 Effacement (%): 90 Cervical Position: Anterior Station: -1 Presentation: Vertex Exam by:: Sharyl NimrodMeredith, CNM          Membranes: AROM for clear fluid  A: Active labor     FHR category 1  P: Will also begin Pitocin at 2 milliunits and increase by 2 milliunits      Reassess in 1-2 hours     Anticipate NSVD  Carlean JewsMeredith Marquasia Schmieder, CNM

## 2016-03-13 LAB — CBC
HCT: 31.4 % — ABNORMAL LOW (ref 35.0–47.0)
HEMOGLOBIN: 11.1 g/dL — AB (ref 12.0–16.0)
MCH: 32.7 pg (ref 26.0–34.0)
MCHC: 35.4 g/dL (ref 32.0–36.0)
MCV: 92.5 fL (ref 80.0–100.0)
Platelets: 185 10*3/uL (ref 150–440)
RBC: 3.4 MIL/uL — ABNORMAL LOW (ref 3.80–5.20)
RDW: 14.2 % (ref 11.5–14.5)
WBC: 12.8 10*3/uL — ABNORMAL HIGH (ref 3.6–11.0)

## 2016-03-13 LAB — RPR: RPR Ser Ql: NONREACTIVE

## 2016-03-13 MED ORDER — NORETHINDRONE 0.35 MG PO TABS: 1.0000 | ORAL_TABLET | Freq: Every day | ORAL | 11 refills | Status: AC

## 2016-03-13 MED ORDER — BENZOCAINE-MENTHOL 20-0.5 % EX AERO: 1.0000 "application " | INHALATION_SPRAY | Freq: Three times a day (TID) | CUTANEOUS | 1 refills | Status: AC

## 2016-03-13 MED ORDER — IBUPROFEN 600 MG PO TABS: 600.0000 mg | ORAL_TABLET | Freq: Four times a day (QID) | ORAL | 0 refills | Status: AC

## 2016-03-13 NOTE — Progress Notes (Signed)
D/C home to car via auxiliary in wheelchair.  

## 2016-03-13 NOTE — Progress Notes (Signed)
D/C instructions provided with interpreter, pt states understanding, aware of follow up appt. Prescriptions given to pt.

## 2016-03-13 NOTE — Progress Notes (Signed)
Pt request information about medicaid, interpreter and medicaid office notified.

## 2017-01-04 ENCOUNTER — Encounter (HOSPITAL_COMMUNITY): Payer: Self-pay

## 2019-07-10 ENCOUNTER — Encounter: Payer: Self-pay | Admitting: Emergency Medicine

## 2019-07-10 ENCOUNTER — Emergency Department: Payer: Self-pay

## 2019-07-10 ENCOUNTER — Other Ambulatory Visit: Payer: Self-pay

## 2019-07-10 DIAGNOSIS — O4690 Antepartum hemorrhage, unspecified, unspecified trimester: Secondary | ICD-10-CM | POA: Insufficient documentation

## 2019-07-10 DIAGNOSIS — Z79899 Other long term (current) drug therapy: Secondary | ICD-10-CM | POA: Insufficient documentation

## 2019-07-10 DIAGNOSIS — R102 Pelvic and perineal pain: Secondary | ICD-10-CM | POA: Insufficient documentation

## 2019-07-10 DIAGNOSIS — Z3A00 Weeks of gestation of pregnancy not specified: Secondary | ICD-10-CM | POA: Insufficient documentation

## 2019-07-10 LAB — BASIC METABOLIC PANEL
Anion gap: 9 (ref 5–15)
BUN: 15 mg/dL (ref 6–20)
CO2: 26 mmol/L (ref 22–32)
Calcium: 9.4 mg/dL (ref 8.9–10.3)
Chloride: 102 mmol/L (ref 98–111)
Creatinine, Ser: 0.62 mg/dL (ref 0.44–1.00)
GFR calc Af Amer: >60 mL/min (ref >60)
GFR calc non Af Amer: >60 mL/min (ref >60)
Glucose, Bld: 100 mg/dL — ABNORMAL HIGH (ref 70–99)
Potassium: 3.9 mmol/L (ref 3.5–5.1)
Sodium: 137 mmol/L (ref 135–145)

## 2019-07-10 LAB — CBC
HCT: 41.1 % (ref 36.0–46.0)
Hemoglobin: 14.5 g/dL (ref 12.0–15.0)
MCH: 30.1 pg (ref 26.0–34.0)
MCHC: 35.3 g/dL (ref 30.0–36.0)
MCV: 85.4 fL (ref 80.0–100.0)
Platelets: 299 10*3/uL (ref 150–400)
RBC: 4.81 MIL/uL (ref 3.87–5.11)
RDW: 12.3 % (ref 11.5–15.5)
WBC: 10.4 10*3/uL (ref 4.0–10.5)
nRBC: 0 % (ref 0.0–0.2)

## 2019-07-10 LAB — URINALYSIS, COMPLETE (UACMP) WITH MICROSCOPIC
Bilirubin Urine: NEGATIVE
Glucose, UA: NEGATIVE mg/dL
Ketones, ur: NEGATIVE mg/dL
Nitrite: NEGATIVE
Protein, ur: NEGATIVE mg/dL
RBC / HPF: >50 RBC/hpf — ABNORMAL HIGH (ref 0–5)
Specific Gravity, Urine: 1.024 (ref 1.005–1.030)
pH: 6 (ref 5.0–8.0)

## 2019-07-10 LAB — HCG, QUANTITATIVE, PREGNANCY: hCG, Beta Chain, Quant, S: 52 m[IU]/mL — ABNORMAL HIGH (ref <5)

## 2019-07-10 LAB — ABO/RH: ABO/RH(D): A POS

## 2019-07-10 LAB — POC URINE PREG, ED: Preg Test, Ur: POSITIVE — AB

## 2019-07-10 NOTE — ED Triage Notes (Signed)
Pt to ED from home c/o vaginal bleeding for three weeks.  States had positive pregnancy test today, and told to come to ER d/t the bleeding and pain.  Third pregnancy for patient.  States mild lower pelvic pain.  States not bleeding continuously, some hematuria and some blood on toilet paper when wipes.  States some clots, denies n/v/d, denies other pregnancy complications.  Pt A&Ox4, skin WNL, chest rise even and unlabored, in NAD at this time.

## 2019-07-11 ENCOUNTER — Emergency Department
Admission: EM | Admit: 2019-07-11 | Discharge: 2019-07-11 | Disposition: A | Discharge status: Home / Self Care | Payer: Self-pay | Attending: Emergency Medicine | Admitting: Emergency Medicine

## 2019-07-11 DIAGNOSIS — N939 Abnormal uterine and vaginal bleeding, unspecified: Secondary | ICD-10-CM

## 2019-07-11 DIAGNOSIS — O4690 Antepartum hemorrhage, unspecified, unspecified trimester: Secondary | ICD-10-CM

## 2019-07-11 NOTE — Discharge Instructions (Addendum)
[employer], en este momento su sangrado podra deberse a un aborto espontneo o un embarazo anormal fuera del tero. Por lo tanto, es extremadamente importante que haga un seguimiento con nosotros o con la clnica del Dr. Bonney Aid en 48 horas para repetir el laboratorio y la ecografa para determinar si tiene un embarazo anormal fuera del tero. No realizar el seguimiento puede provocar complicaciones graves, como hemorragia grave, shock o la muerte. Asegrese de llamar a su oficina a primera hora de la maana para programar una cita. Si no puede conseguir una cita con su clnica, vuelva a la sala de emergencias. Regrese a la sala de emergencias en cualquier momento si tiene dolor abdominal intenso, si se desmaya o si tiene un sangrado severo que le preocupa.  As I explained to you, at this time your bleeding could be due to a miscarriage or an abnormal pregnancy outside of the uterus. Therefore it is extremely important that you follow up with [place] or with Dr. Ramiro Harvest clinic in 48 hours for repeat lab and ultrasound to determine if you have an abnormal pregnancy outside of the uterus. Failing to follow up may lead to serious complications, including severe bleeding, shock, or death. Please make sure to call his office first thing in the morning to schedule an appointment. If you are unable to get an appointment with his clinic, please return to the ER instead. Return to the ER at any time if you have severe abdominal pain, if you pass out, or if you have severe bleeding that is concerning to you.

## 2019-07-11 NOTE — ED Provider Notes (Signed)
[facility] Emergency Department Provider Note  ____________________________________________  Time seen: Approximately 1:59 AM  I have reviewed the triage vital signs and the nursing notes.   HISTORY  Chief Complaint Vaginal Bleeding   HPI Autumn Hunter is a 39 y.o. female G3P2 currently at [redacted] weeks GA per LMP who presents for vaginal bleeding. LMP 06/06/19. Patient has had vaginal bleeding for 3 weeks. Some days heavier than others. Has passed blood clots. She has mild intermittent cramping lower abdominal pain associated with it.  No lightheadedness, no chest pain or shortness of breath.  She is not on blood thinners.  No history of bleeding disorders.  Patient reports taking a couple pregnancy tests at home which were positive but then negative.  Today she went to urgent care had a positive test and was sent here for evaluation.   Past Medical History:  Diagnosis Date  . Medical history non-contributory     Patient Active Problem List   Diagnosis Date Noted  . Postpartum care following vaginal delivery 03/12/2016  . Acute blood loss anemia 03/12/2016  . Postpartum hemorrhage, third stage, delivered 03/12/2016  . Labor and delivery, indication for care 03/11/2016    Past Surgical History:  Procedure Laterality Date  . NO PAST SURGERIES      Prior to Admission medications   Medication Sig Start Date End Date Taking? Authorizing Provider  benzocaine-Menthol (DERMOPLAST) 20-0.5 % AERO Apply 1 application topically 3 (three) times daily. 03/13/16   Schermerhorn, Gwen Her, MD  ibuprofen (ADVIL,MOTRIN) 600 MG tablet Take 1 tablet (600 mg total) by mouth every 6 (six) hours. 03/13/16   Schermerhorn, Gwen Her, MD  norethindrone (ORTHO MICRONOR) 0.35 MG tablet Take 1 tablet (0.35 mg total) by mouth daily. 03/13/16   Schermerhorn, Gwen Her, MD  Prenatal Vit-Fe Fumarate-FA (PRENATAL MULTIVITAMIN) TABS tablet Take 1 tablet by mouth daily at 12 noon.    [provider]    Allergies Patient has no known allergies.  Family History  Problem Relation Age of Onset  . Cancer Mother   . Diabetes Father     Social History Social History   Tobacco Use  . Smoking status: Never Smoker  . Smokeless tobacco: Never Used  Substance Use Topics  . Alcohol use: No  . Drug use: No    Review of Systems  Constitutional: Negative for fever. Eyes: Negative for visual changes. ENT: Negative for sore throat. Neck: No neck pain  Cardiovascular: Negative for chest pain. Respiratory: Negative for shortness of breath. Gastrointestinal: + cramping abdominal pain. No vomiting or diarrhea. Genitourinary: Negative for dysuria. + Vaginal bleeding Musculoskeletal: Negative for back pain. Skin: Negative for rash. Neurological: Negative for headaches, weakness or numbness. Psych: No SI or HI  ____________________________________________   PHYSICAL EXAM:  VITAL SIGNS: ED Triage Vitals  Enc Vitals Group     BP 07/10/19 1916 125/75     Pulse Rate 07/10/19 1916 72     Resp 07/10/19 1916 14     Temp 07/10/19 1916 98.9 F (37.2 C)     Temp Source 07/10/19 1916 Oral     SpO2 07/10/19 1916 99 %     Weight 07/10/19 1922 142 lb (64.4 kg)     Height 07/10/19 1922 5\' 5"  (1.651 m)     Head Circumference --      Peak Flow --      Pain Score 07/10/19 1921 0     Pain Loc --  Pain Edu? --      Excl. in GC? --     Constitutional: Alert and oriented. Well appearing and in no apparent distress. HEENT:      Head: Normocephalic and atraumatic.         Eyes: Conjunctivae are normal. Sclera is non-icteric.       Mouth/Throat: Mucous membranes are moist.       Neck: Supple with no signs of meningismus. Cardiovascular: Regular rate and rhythm.  Respiratory: Normal respiratory effort. Lungs are clear to auscultation bilaterally.  Gastrointestinal: Soft, non tender, and non distended with positive bowel sounds. No rebound or guarding. Musculoskeletal: No  edema, cyanosis, or erythema of extremities. Neurologic: Normal speech and language. Face is symmetric. Moving all extremities. No gross focal neurologic deficits are appreciated. Skin: Skin is warm, dry and intact. No rash noted. Psychiatric: Mood and affect are normal. Speech and behavior are normal.  ____________________________________________   LABS (all labs ordered are listed, but only abnormal results are displayed)  Labs Reviewed  BASIC METABOLIC PANEL - Abnormal; Notable for the following components:      Result Value   Glucose, Bld 100 (*)    All other components within normal limits  HCG, QUANTITATIVE, PREGNANCY - Abnormal; Notable for the following components:   hCG, Beta Chain, Quant, S 52 (*)    All other components within normal limits  URINALYSIS, COMPLETE (UACMP) WITH MICROSCOPIC - Abnormal; Notable for the following components:   Color, Urine YELLOW (*)    APPearance HAZY (*)    Hgb urine dipstick LARGE (*)    Leukocytes,Ua TRACE (*)    RBC / HPF >50 (*)    Bacteria, UA RARE (*)    All other components within normal limits  POC URINE PREG, ED - Abnormal; Notable for the following components:   Preg Test, Ur Positive (*)    All other components within normal limits  CBC  ABO/RH   ____________________________________________  EKG  none  ____________________________________________  RADIOLOGY  I have personally reviewed the images performed during this visit and I agree with the Radiologist's read.   Interpretation by Radiologist:  US OB LESS THAN 14 WEEKS WITH OB TRANSVAGINAL  Result Date: 07/10/2019 CLINICAL DATA:  Vaginal bleeding x3 weeks. EXAM: OBSTETRIC <14 WK [place] AND TRANSVAGINAL OB US TECHNIQUE: Both transabdominal and transvaginal ultrasound examinations were performed for complete evaluation of the gestation as well as the maternal uterus, adnexal regions, and pelvic cul-de-sac. Transvaginal technique was performed to assess early pregnancy.  COMPARISON:  None. FINDINGS: Intrauterine gestational sac: None Yolk sac:  Not Visualized. Embryo:  Not Visualized. Cardiac Activity: Not Visualized. Heart Rate: N/A  bpm Subchorionic hemorrhage:  None visualized. Maternal uterus/adnexae: The right ovary is normal in appearance. A 1.4 cm x 4.8 cm x 1.8 cm area of heterogeneous hypoechogenicity is seen adjacent to the left ovary. IMPRESSION: 1. No evidence of an intrauterine pregnancy. While this may be secondary to early pregnancy. Correlation with follow-up pelvic ultrasound is recommended. 2. Area of heterogeneous hypoechogenicity adjacent to the left ovary. While this may represent a complex left ovarian cyst, further evaluation is recommended to exclude a left ectopic pregnancy. Electronically Signed   By: Aram Candela M.D.   On: 07/10/2019 20:57     ____________________________________________   PROCEDURES  Procedure(s) performed: None Procedures Critical Care performed:  None ____________________________________________   INITIAL IMPRESSION / ASSESSMENT AND PLAN / ED COURSE  39 y.o. female G3P2 currently at [redacted] weeks GA per LMP who  presents for vaginal bleeding.  Patient is hemodynamically stable, abdomen is soft with no tenderness throughout.  Labs reviewed showing no evidence of acute blood loss anemia.  hCG of 52.  Blood type of A+ no indication for RhoGam.  Transvaginal ultrasound showed no evidence of IUP which is expected with such a low hCG.  However it did show a heterogeneous fluid collection over at the left adnexa.  The differential includes ectopic pregnancy versus ovarian cyst versus corpus luteum.  Discussed patient's presentation with Dr. Bonney Aid, OB/GYN on-call, who recommended 48-hour follow-up either in the ED or in the clinic for repeat hCG and close monitoring for potential ectopic pregnancy.  I discussed very strict return precautions with patient and recommended return to the emergency room for severe abdominal pain,  syncope, or any signs of acute blood loss anemia.  Otherwise patient is to follow-up in 48 hours for repeat hCG.  Old medical records reviewed.      _____________________________________________ Please note:  Patient was evaluated in Emergency Department today for the symptoms described in the history of present illness. Patient was evaluated in the context of the global COVID-19 pandemic, which necessitated consideration that the patient might be at risk for infection with the SARS-CoV-2 virus that causes COVID-19. Institutional protocols and algorithms that pertain to the evaluation of patients at risk for COVID-19 are in a state of rapid change based on information released by regulatory bodies including the CDC and federal and state organizations. These policies and algorithms were followed during the patient's care in the ED.  Some ED evaluations and interventions may be delayed as a result of limited staffing during the pandemic.   [place] Controlled Substance Database was reviewed by me. ____________________________________________   FINAL CLINICAL IMPRESSION(S) / ED DIAGNOSES   Final diagnoses:  Vaginal bleeding in pregnancy      NEW MEDICATIONS STARTED DURING THIS VISIT:  ED Discharge Orders    None       Note:  This document was prepared using Dragon voice recognition software and may include unintentional dictation errors.    Nita Sickle, MD 07/11/19 [phone]

## 2019-07-12 ENCOUNTER — Other Ambulatory Visit: Payer: Self-pay

## 2019-07-12 ENCOUNTER — Encounter: Payer: Self-pay | Admitting: Obstetrics & Gynecology

## 2019-07-12 ENCOUNTER — Ambulatory Visit (INDEPENDENT_AMBULATORY_CARE_PROVIDER_SITE_OTHER): Payer: Self-pay | Admitting: Obstetrics & Gynecology

## 2019-07-12 ENCOUNTER — Other Ambulatory Visit (HOSPITAL_COMMUNITY)
Admission: RE | Admit: 2019-07-12 | Discharge: 2019-07-12 | Disposition: A | Discharge status: Home / Self Care | Payer: Self-pay | Source: Ambulatory Visit | Attending: Obstetrics & Gynecology | Admitting: Obstetrics & Gynecology

## 2019-07-12 VITALS — BP 120/70 | Wt 143.0 lb

## 2019-07-12 DIAGNOSIS — Z124 Encounter for screening for malignant neoplasm of cervix: Secondary | ICD-10-CM | POA: Insufficient documentation

## 2019-07-12 DIAGNOSIS — O209 Hemorrhage in early pregnancy, unspecified: Secondary | ICD-10-CM | POA: Insufficient documentation

## 2019-07-12 NOTE — Patient Instructions (Signed)
Hemorragia vaginal durante el embarazo (primer trimestre) °Vaginal Bleeding During Pregnancy, First Trimester ° °Durante los primeros meses del embarazo, es común tener una pequeña hemorragia vaginal (manchado). A veces, la hemorragia es normal y no causa problemas. En otras ocasiones, sin embargo, la hemorragia puede ser una señal de algo grave. Informe a su médico de inmediato si tiene algún tipo de hemorragia vaginal. °Siga estas indicaciones en su casa: °Actividad °· Siga las indicaciones de su médico con respecto al grado de actividad que puede realizar. °· De ser necesario, organícese para que alguien la ayude con las actividades habituales. °· No tenga relaciones sexuales ni orgasmos hasta que el médico le diga que es seguro. °Instrucciones generales °· Tome los medicamentos de venta libre y los recetados solamente como se lo haya indicado el médico. °· Controle su afección para detectar cualquier cambio. °· Escriba los siguientes datos: °? La cantidad de toallas higiénicas que usa cada día. °? La frecuencia con la que se cambia las toallas higiénicas. °? Qué tan empapadas (saturadas) están. °· No use tampones. °· No se haga duchas vaginales. °· Si elimina tejido por la vagina, guárdelo para mostrárselo al médico. °· Concurra a todas las visitas de seguimiento como se lo haya indicado el médico. Esto es importante. °Comuníquese con un médico si: °· Tiene una hemorragia vaginal durante el embarazo. °· Tiene cólicos. °· Tiene fiebre. °Solicite ayuda de inmediato si: °· Siente cólicos muy intensos en la espalda o en el vientre (abdomen). °· Elimina coágulos grandes o mucho tejido por la vagina. °· La hemorragia empeora. °· Siente que va a desvanecerse. °· Se siente débil. °· Pierde el conocimiento (se desmaya). °· Tiene escalofríos. °· Tiene una pérdida de líquido por la vagina. °· Tiene una pérdida de líquido abundante por la vagina. °Resumen °· A veces, la hemorragia vaginal durante el embarazo es normal y no  causa problemas. En otras ocasiones, la hemorragia puede ser una señal de algo grave. °· Informe a su médico de inmediato si tiene algún tipo de hemorragia vaginal. °· Siga las indicaciones de su médico con respecto al grado de actividad que puede realizar. [place] necesite que alguien la ayude con las actividades habituales. °Esta información no tiene como fin reemplazar el consejo del médico. Asegúrese de hacerle al médico cualquier pregunta que tenga. °Document Revised: 11/12/2016 Document Reviewed: 11/12/2016 °Elsevier Patient Education © 2020 [employer] ° °

## 2019-07-12 NOTE — Progress Notes (Signed)
Obstetric Problem Visit   Chief Complaint:  Chief Complaint  Patient presents with  . Follow-up    History of Present Illness: Patient is a 39 y.o. [id number] Unknown presenting for first trimester bleeding.  The onset of bleeding was 3 weeks ago, with brief couple of days without bleeding; finally realiized wasn't period and had home preg test pos.  ER yesterday.  Mild suprapubic pain without radiation. No nausea or breast T.  Is bleeding equal to or greater than normal menstrual flow:  Yes Any recent trauma:  No Recent intercourse:  No History of prior miscarriage:  No Prior ultrasound demonstrating IUP:  No Prior ultrasound demonstrating viable IUP:  No Prior Serum HCG:  Yes Rh status: POS  PMHx: She  has a past medical history of Medical history non-contributory. Also,  has a past surgical history that includes No past surgeries., family history includes Cancer in her mother; Diabetes in her father.,  reports that she has never smoked. She has never used smokeless tobacco. She reports that she does not drink alcohol or use drugs.  She has a current medication list which includes the following prescription(s): benzocaine-menthol, ibuprofen, norethindrone, and prenatal multivitamin. Also, has No Known Allergies.  Review of Systems  Constitutional: Negative for chills, fever and malaise/fatigue.  HENT: Negative for congestion, sinus pain and sore throat.   Eyes: Negative for blurred vision and pain.  Respiratory: Negative for cough and wheezing.   Cardiovascular: Negative for chest pain and leg swelling.  Gastrointestinal: Positive for abdominal pain. Negative for constipation, diarrhea, heartburn, nausea and vomiting.  Genitourinary: Negative for dysuria, frequency, hematuria and urgency.  Musculoskeletal: Negative for back pain, joint pain, myalgias and neck pain.  Skin: Negative for itching and rash.  Neurological: Negative for dizziness, tremors and weakness.    Endo/Heme/Allergies: Does not bruise/bleed easily.  Psychiatric/Behavioral: Negative for depression. The patient is not nervous/anxious and does not have insomnia.     Objective: Vitals:   07/12/19 1426  BP: 120/70   Physical Exam Constitutional:      General: She is not in acute distress.    Appearance: She is well-developed.  Genitourinary:     Pelvic exam was performed with patient supine.     Uterus normal.     Vaginal bleeding present.     No vaginal erythema.     No cervical motion tenderness, discharge, polyp or nabothian cyst.     Uterus is mobile.     Uterus is not enlarged.     No uterine mass detected.    Uterus is midaxial.     No right or left adnexal mass present.     Right adnexa not tender.     Left adnexa not tender.     Genitourinary Comments: Cx closed, normal integrity  HENT:     Head: Normocephalic and atraumatic.     Nose: Nose normal.  Abdominal:     General: There is no distension.     Palpations: Abdomen is soft.     Tenderness: There is no abdominal tenderness.  Musculoskeletal:        General: Normal range of motion.  Neurological:     Mental Status: She is alert and oriented to person, place, and time.     Cranial Nerves: No cranial nerve deficit.  Skin:    General: Skin is warm and dry.  Psychiatric:        Attention and Perception: Attention normal.        Mood and  Affect: Mood and affect normal.        Speech: Speech normal.        Behavior: Behavior normal.        Thought Content: Thought content normal.        Judgment: Judgment normal.     Assessment: 39 y.o. G3P0101 Unknown 1. First trimester bleeding - Beta hCG quant (ref lab) repeat today.  It was 52 on 07/10/19. - US OB LESS THAN 14 WEEKS WITH OB TRANSVAGINAL; Future, if beta rises appropriately  2. Screening for cervical cancer - Cytology - PAP  1) First trimester bleeding - incidence and clinical course of first trimester bleeding is discussed in detail with the  patient today.  Approximately 1/3 of pregnancies ending in live births experienced 1st trimester bleeding.  The amount of bleeding is variable and not necessarily predictive of outcome.  Sources may be cervical or uterine.  Subchorionic hemorrhages are a frequent concurrent findings on ultrasound and are followed expectantly.  These often absorb or regress spontaneously although risk for expansion and further disruption of the utero-placental interface leading to miscarriage is possible.  There is no clearly documented benefit to limiting or modifying activity and sexual intercourse in altering clinic course of 1st trimester bleeding.    2) If not already done will proceed with TVUS evaluation to document viability, and if uncertain viability or absence of a demonstrable IUP (and no previous documentation of IUP) will trend HCG levels.  3) The patient is Rh POS, rhogam is therefore not indicated to decrease the risk rhesus alloimmunization.    4) Routine bleeding precautions were discussed with the patient prior the conclusion of today's visit.  Annamarie Major, MD, [facility], [facility] 07/12/2019  2:38 PM

## 2019-07-13 ENCOUNTER — Telehealth: Payer: Self-pay | Admitting: Obstetrics & Gynecology

## 2019-07-13 LAB — BETA HCG QUANT (REF LAB): hCG Quant: 34 m[IU]/mL

## 2019-07-13 NOTE — Telephone Encounter (Signed)
Patient's husband whom is on DPR is calling to follow up on lab results. Please advise. They are a where RPH is on call at hospital and will call with results once recieved

## 2019-07-14 LAB — CYTOLOGY - PAP
Chlamydia: NEGATIVE
Comment: NEGATIVE
Comment: NEGATIVE
Comment: NORMAL
Diagnosis: NEGATIVE
Neisseria Gonorrhea: NEGATIVE
Trichomonas: NEGATIVE

## 2019-07-14 NOTE — Telephone Encounter (Signed)
Patient's husband is calling to follow up on Labs. He is on patient DPR and would like to be contacted on results. Please call [phone]

## 2019-07-17 ENCOUNTER — Other Ambulatory Visit: Payer: Self-pay | Admitting: Obstetrics & Gynecology

## 2019-07-17 DIAGNOSIS — O209 Hemorrhage in early pregnancy, unspecified: Secondary | ICD-10-CM

## 2019-07-17 NOTE — Telephone Encounter (Signed)
Pt called and says she was told to call if she was still bleeding. Says she's not bleeding like she was, but still is. Says notices it when she goes to bathroom and wipes. She feels super bloated and her belly is hard. Please advise.

## 2019-07-17 NOTE — Telephone Encounter (Signed)
Patient is schedule at 10 am for lab and 10:20 for follow up with [facility]. Please place lab orders. Thank you!

## 2019-07-17 NOTE — Telephone Encounter (Signed)
Sch appt tomorrow for labs and WI exam, any

## 2019-07-18 ENCOUNTER — Other Ambulatory Visit: Payer: Self-pay

## 2019-07-18 ENCOUNTER — Encounter: Payer: Self-pay | Admitting: Obstetrics & Gynecology

## 2019-07-18 NOTE — Telephone Encounter (Signed)
per pt cancel will call back to reschedule. Patient reports she is feeling better

## 2019-07-26 ENCOUNTER — Other Ambulatory Visit: Payer: Self-pay

## 2019-07-26 ENCOUNTER — Encounter: Payer: Self-pay | Admitting: Obstetrics and Gynecology

## 2019-08-01 NOTE — Telephone Encounter (Signed)
Possible start to first period since miscarriage.  No labs or scans needed.  Monitor for pattern of periods over next 2-3 mos.

## 2019-08-01 NOTE — Telephone Encounter (Signed)
Called pt, no answer, LVMTRC. 

## 2019-08-01 NOTE — Telephone Encounter (Signed)
Pt called and said she has been bleeding and passing clots at times since last Friday (when she wipes only, nothing on pad). There is some pelvic pain at times. Please advise.

## 2019-08-01 NOTE — Telephone Encounter (Signed)
Pt aware. Also advised if the bleeding is more where she has to change more than once in less than an hour, to go to ER.

## 2020-05-22 ENCOUNTER — Ambulatory Visit: Payer: Self-pay | Admitting: Hospice and Palliative Medicine

## 2020-06-03 ENCOUNTER — Ambulatory Visit: Payer: Self-pay | Admitting: Internal Medicine

## 2020-11-27 ENCOUNTER — Emergency Department: Payer: Self-pay

## 2020-11-27 ENCOUNTER — Other Ambulatory Visit: Payer: Self-pay

## 2020-11-27 ENCOUNTER — Emergency Department
Admission: EM | Admit: 2020-11-27 | Discharge: 2020-11-27 | Disposition: A | Discharge status: Home / Self Care | Payer: Self-pay | Attending: Emergency Medicine | Admitting: Emergency Medicine

## 2020-11-27 DIAGNOSIS — R5383 Other fatigue: Secondary | ICD-10-CM | POA: Insufficient documentation

## 2020-11-27 DIAGNOSIS — R110 Nausea: Secondary | ICD-10-CM | POA: Insufficient documentation

## 2020-11-27 DIAGNOSIS — R1011 Right upper quadrant pain: Secondary | ICD-10-CM | POA: Insufficient documentation

## 2020-11-27 DIAGNOSIS — Z20822 Contact with and (suspected) exposure to covid-19: Secondary | ICD-10-CM | POA: Insufficient documentation

## 2020-11-27 DIAGNOSIS — R1013 Epigastric pain: Secondary | ICD-10-CM

## 2020-11-27 DIAGNOSIS — H5789 Other specified disorders of eye and adnexa: Secondary | ICD-10-CM | POA: Insufficient documentation

## 2020-11-27 LAB — COMPREHENSIVE METABOLIC PANEL
ALT: 19 U/L (ref 0–44)
AST: 16 U/L (ref 15–41)
Albumin: 4.1 g/dL (ref 3.5–5.0)
Alkaline Phosphatase: 52 U/L (ref 38–126)
Anion gap: 11 (ref 5–15)
BUN: 13 mg/dL (ref 6–20)
CO2: 24 mmol/L (ref 22–32)
Calcium: 9 mg/dL (ref 8.9–10.3)
Chloride: 100 mmol/L (ref 98–111)
Creatinine, Ser: 0.42 mg/dL — ABNORMAL LOW (ref 0.44–1.00)
GFR, Estimated: >60 mL/min (ref >60)
Glucose, Bld: 117 mg/dL — ABNORMAL HIGH (ref 70–99)
Potassium: 4.2 mmol/L (ref 3.5–5.1)
Sodium: 135 mmol/L (ref 135–145)
Total Bilirubin: 0.6 mg/dL (ref 0.3–1.2)
Total Protein: 7.5 g/dL (ref 6.5–8.1)

## 2020-11-27 LAB — CBC
HCT: 39.2 % (ref 36.0–46.0)
Hemoglobin: 14.2 g/dL (ref 12.0–15.0)
MCH: 31.5 pg (ref 26.0–34.0)
MCHC: 36.2 g/dL — ABNORMAL HIGH (ref 30.0–36.0)
MCV: 86.9 fL (ref 80.0–100.0)
Platelets: 293 10*3/uL (ref 150–400)
RBC: 4.51 MIL/uL (ref 3.87–5.11)
RDW: 12.4 % (ref 11.5–15.5)
WBC: 9.4 10*3/uL (ref 4.0–10.5)
nRBC: 0 % (ref 0.0–0.2)

## 2020-11-27 LAB — TROPONIN I (HIGH SENSITIVITY)
Troponin I (High Sensitivity): <2 ng/L (ref <18)
Troponin I (High Sensitivity): <2 ng/L (ref <18)

## 2020-11-27 LAB — LIPASE, BLOOD: Lipase: 34 U/L (ref 11–51)

## 2020-11-27 LAB — RESP PANEL BY RT-PCR (FLU A&B, COVID) ARPGX2
Influenza A by PCR: NEGATIVE
Influenza B by PCR: NEGATIVE
SARS Coronavirus 2 by RT PCR: NEGATIVE

## 2020-11-27 MED ORDER — PANTOPRAZOLE SODIUM 40 MG PO TBEC
40.0000 mg | DELAYED_RELEASE_TABLET | Freq: Every day | ORAL | 1 refills | Status: AC
Start: 2020-11-27 — End: 2021-11-27

## 2020-11-27 NOTE — ED Triage Notes (Signed)
Interpreter # [phone] Pt reports epigastric pain x3 days. +nausea.  Denies shob.

## 2020-11-27 NOTE — ED Provider Notes (Signed)
Emergency Medicine Provider Triage Evaluation Note  Autumn Hunter , a 40 y.o. female  was evaluated in triage.  Pt complains of epigastric pain.  Patient had a cough last week.  No recent known COVID.  No vomiting.  Patient does still have a gallbladder.  Review of Systems  Positive: Abdominal pain Negative: No fever, chills, vomiting or diarrhea  Physical Exam  BP 123/81   Pulse 64   Temp 99 F (37.2 C) (Oral)   Resp 16   Ht 5\' 5"  (1.651 m)   Wt 68 kg   LMP 10/07/2020 (Exact Date)   SpO2 98%   BMI 24.96 kg/m  Gen:   Awake, no distress   Resp:  Normal effort  MSK:   Moves extremities without difficulty  Other:    Medical Decision Making  Medically screening exam initiated at 8:51 AM.  Appropriate orders placed.  12/07/2020 was informed that the remainder of the evaluation will be completed by another provider, this initial triage assessment does not replace that evaluation, and the importance of remaining in the ED until their evaluation is complete.     Davonna Belling, PA-C 11/27/20 11/29/20    [id number], MD 11/27/20 1201

## 2020-11-27 NOTE — ED Provider Notes (Signed)
[facility] Emergency Department Provider Note  Time seen: 12:31 PM  I have reviewed the triage vital signs and the nursing notes.   HISTORY  Chief Complaint Abdominal Pain   HPI Autumn Hunter is a 40 y.o. female with no significant past medical history who presents to the emergency department for 3 days of upper abdominal discomfort.  According to the patient for the past 3 days she has been experiencing intermittent pain across her upper abdomen.  No clear association with food.  States the pain is worse during the day and somewhat relieved at night.  States nausea and states she has been feeling fatigued and has noticed some redness in her eyes over the past few days as well.  Denies any cough or shortness of breath.  No vomiting diarrhea or constipation.  No dysuria.   Past Medical History:  Diagnosis Date   Medical history non-contributory     Patient Active Problem List   Diagnosis Date Noted   First trimester bleeding 07/12/2019   Postpartum care following vaginal delivery 03/12/2016   Acute blood loss anemia 03/12/2016   Postpartum hemorrhage, third stage, delivered 03/12/2016   Labor and delivery, indication for care 03/11/2016    Past Surgical History:  Procedure Laterality Date   NO PAST SURGERIES      Prior to Admission medications   Medication Sig Start Date End Date Taking? Authorizing Provider  benzocaine-Menthol (DERMOPLAST) 20-0.5 % AERO Apply 1 application topically 3 (three) times daily. Patient not taking: Reported on 07/12/2019 03/13/16   Schermerhorn, Ihor Austin, MD  ibuprofen (ADVIL,MOTRIN) 600 MG tablet Take 1 tablet (600 mg total) by mouth every 6 (six) hours. Patient not taking: Reported on 07/12/2019 03/13/16   Schermerhorn, Ihor Austin, MD  norethindrone (ORTHO MICRONOR) 0.35 MG tablet Take 1 tablet (0.35 mg total) by mouth daily. Patient not taking: Reported on 07/12/2019 03/13/16   Schermerhorn, Ihor Austin, MD  Prenatal Vit-Fe  Fumarate-FA (PRENATAL MULTIVITAMIN) TABS tablet Take 1 tablet by mouth daily at 12 noon.    [provider]    No Known Allergies  Family History  Problem Relation Age of Onset   Cancer Mother    Diabetes Father     Social History Social History   Tobacco Use   Smoking status: Never   Smokeless tobacco: Never  Substance Use Topics   Alcohol use: No   Drug use: No    Review of Systems Constitutional: Negative for fever. Cardiovascular: Negative for chest pain. Respiratory: Negative for shortness of breath. Gastrointestinal: Mild upper abdominal discomfort.  Negative for vomiting or diarrhea but positive for nausea. Genitourinary: Negative for urinary compaints Musculoskeletal: Negative for musculoskeletal complaints Neurological: Negative for headache All other ROS negative  ____________________________________________   PHYSICAL EXAM:  VITAL SIGNS: ED Triage Vitals  Enc Vitals Group     BP 11/27/20 0839 123/81     Pulse Rate 11/27/20 0839 64     Resp 11/27/20 0839 16     Temp 11/27/20 0844 99 F (37.2 C)     Temp Source 11/27/20 0844 Oral     SpO2 11/27/20 0839 98 %     Weight 11/27/20 0841 150 lb (68 kg)     Height 11/27/20 0841 5\' 5"  (1.651 m)     Head Circumference --      Peak Flow --      Pain Score 11/27/20 0840 8     Pain Loc --      Pain Edu? --  Excl. in GC? --    Constitutional: Alert and oriented. Well appearing and in no distress. Eyes: Normal exam ENT      Head: Normocephalic and atraumatic.      Mouth/Throat: Mucous membranes are moist. Cardiovascular: Normal rate, regular rhythm.  Respiratory: Normal respiratory effort without tachypnea nor retractions. Breath sounds are clear Gastrointestinal: Soft, slight epigastric tenderness palpation without rebound guarding or distention. Musculoskeletal: Nontender with normal range of motion in all extremities. Neurologic:  Normal speech and language. No gross focal neurologic  deficits Skin:  Skin is warm, dry and intact.  Psychiatric: Mood and affect are normal.   ____________________________________________    EKG  EKG viewed and interpreted by myself shows normal sinus rhythm at 65 bpm with a narrow QRS, normal axis, normal intervals, no concerning ST changes.  ____________________________________________    RADIOLOGY  Ultrasound is negative for acute abnormality. Chest x-ray is negative.  ____________________________________________   INITIAL IMPRESSION / ASSESSMENT AND PLAN / ED COURSE  Pertinent labs & imaging results that were available during my care of the patient were reviewed by me and considered in my medical decision making (see chart for details).   Patient presents to the emergency department for 3 days of intermittent epigastric discomfort.  Overall patient appears well does state mild tenderness to palpation in the epigastrium.  No rebound guarding or distention.  Denies any chest pain.  Patient does state occasional acid reflux.  Patient's work-up is reassuring including normal LFTs lipase, normal white blood cell count, normal right upper quadrant ultrasound.  Negative troponin with reassuring EKG and chest x-ray.  Given the patient's reassuring work-up I believe she is safe for discharge home.  We will send the patient on Protonix and have her follow-up with her doctor.  Given the patient's 3 days of fatigue intermittent nausea and self-reported redness in her eyes we will obtain a COVID swab as a precaution.  Patient agreeable to plan of care.  [name] was evaluated in Emergency Department on 11/27/2020 for the symptoms described in the history of present illness. She was evaluated in the context of the global COVID-19 pandemic, which necessitated consideration that the patient might be at risk for infection with the SARS-CoV-2 virus that causes COVID-19. Institutional protocols and algorithms that pertain to the evaluation of  patients at risk for COVID-19 are in a state of rapid change based on information released by regulatory bodies including the CDC and federal and state organizations. These policies and algorithms were followed during the patient's care in the ED.  ____________________________________________   FINAL CLINICAL IMPRESSION(S) / ED DIAGNOSES  Epigastric pain   Minna Antis, MD 11/27/20 1233

## 2020-11-27 NOTE — ED Triage Notes (Signed)
MSE done by Darl Pikes PA while in triage

## 2022-01-22 IMAGING — CR DG CHEST 2V
1 series · 2 of 2 positions shown · non-contrast
Comparison: No priors.

CLINICAL DATA: 40-year-old female with history of epigastric pain.
Cough.

EXAM:
CHEST - 2 VIEW

[Series 1: w chest pa · 0.14mm/px · 2 of 2 slices shown]
[im 1/2]
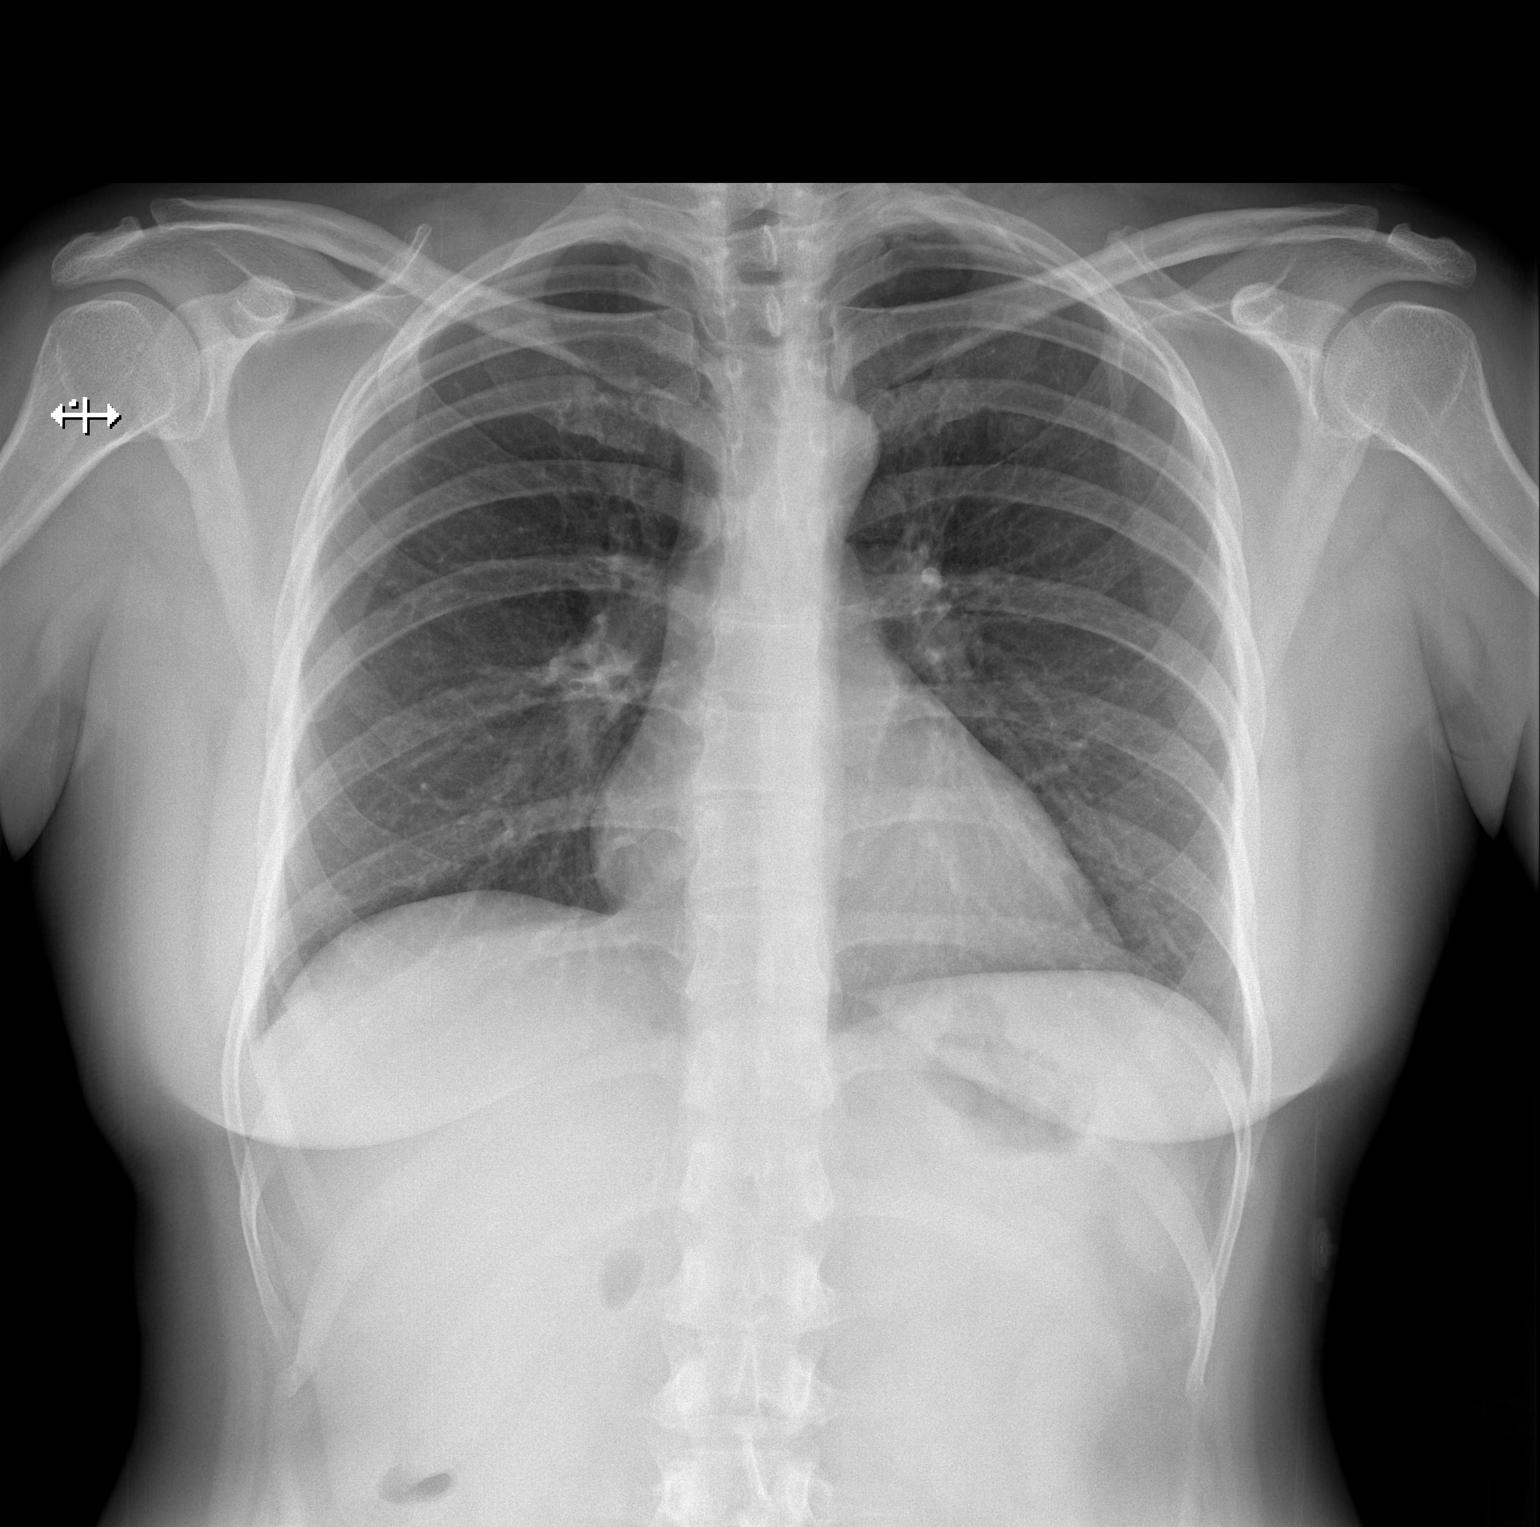
[im 2/2]
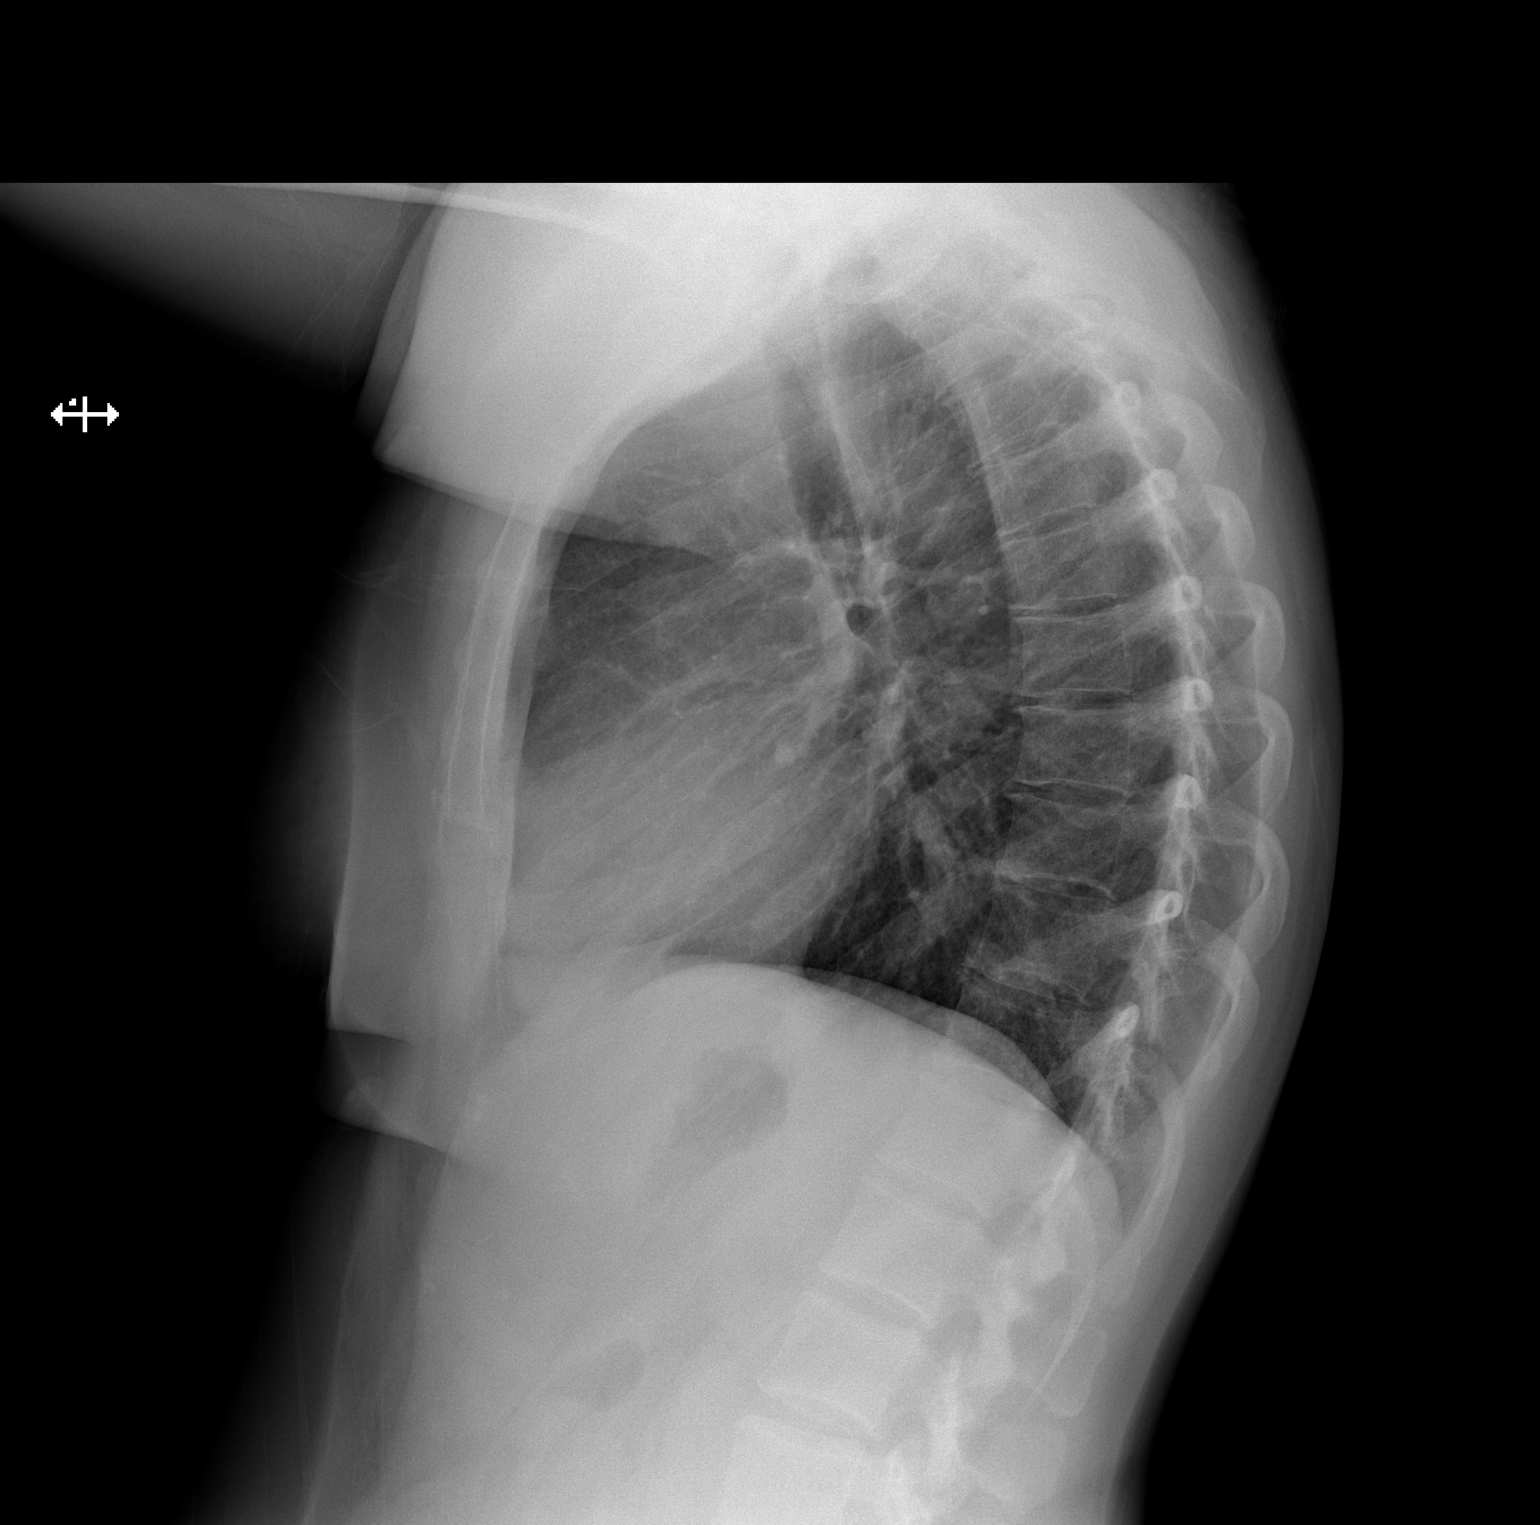

[2 of 2 positions shown; findings below may reference images not displayed]

FINDINGS: Lung volumes are normal. No consolidative airspace disease. No
pleural effusions. No pneumothorax. No pulmonary nodule or mass
noted. Pulmonary vasculature and the cardiomediastinal silhouette
are within normal limits.
IMPRESSION: No radiographic evidence of acute cardiopulmonary disease.

## 2022-01-22 IMAGING — US US ABDOMEN LIMITED
1 series · 14 of 25 positions shown · non-contrast
Comparison: None.

CLINICAL DATA: Right upper quadrant pain

EXAM:
ULTRASOUND ABDOMEN LIMITED RIGHT UPPER QUADRANT

[Series 1: us abdomen limited ruq (liver/gb) · 14 of 61 slices shown]
[im 1/61]
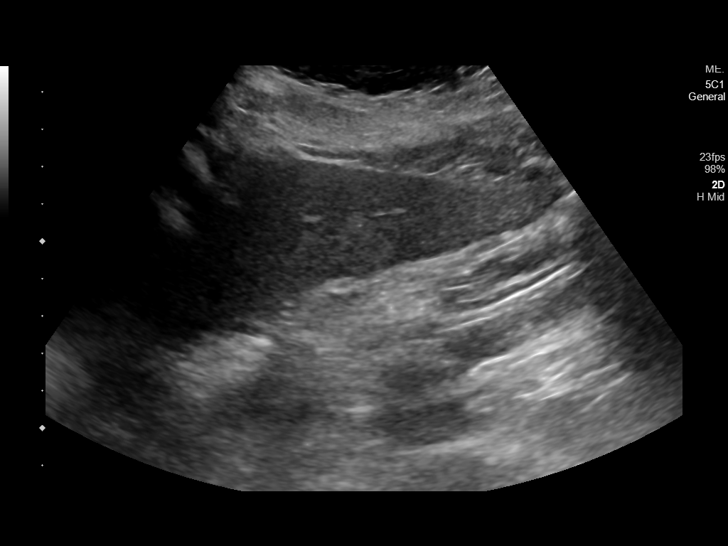
[im 6/61]
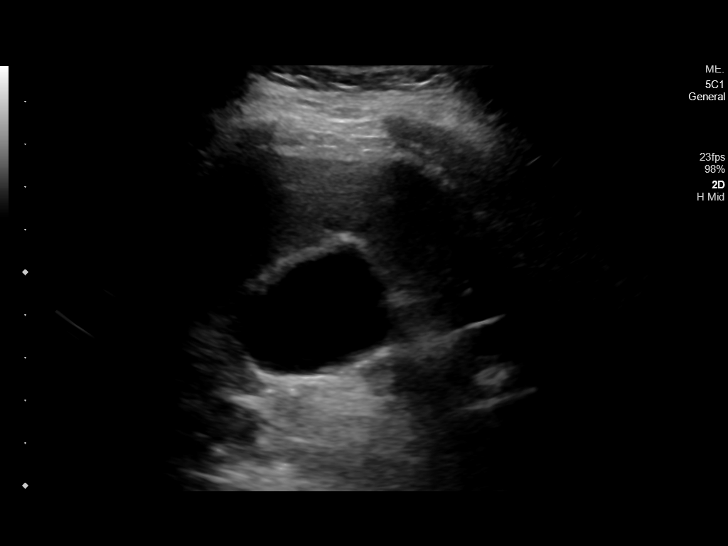
[im 11/61]
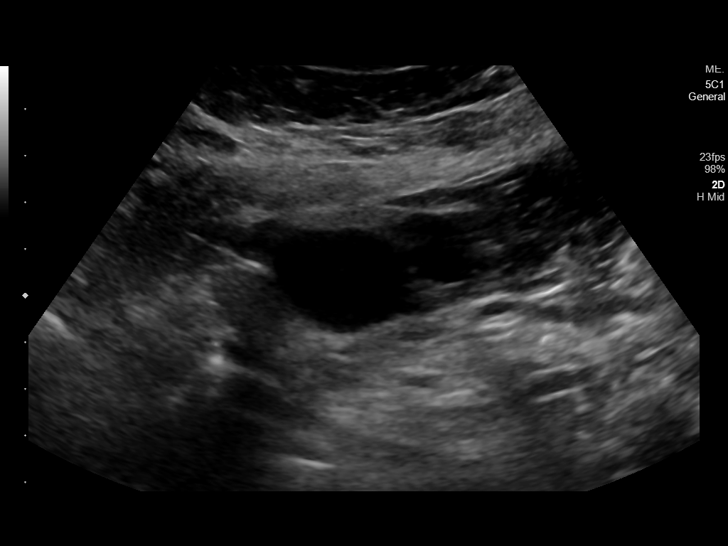
[im 16/61]
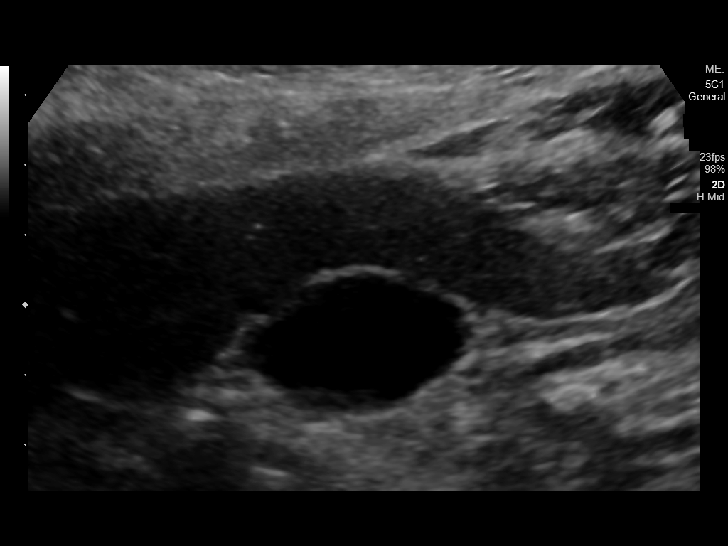
[im 21/61]
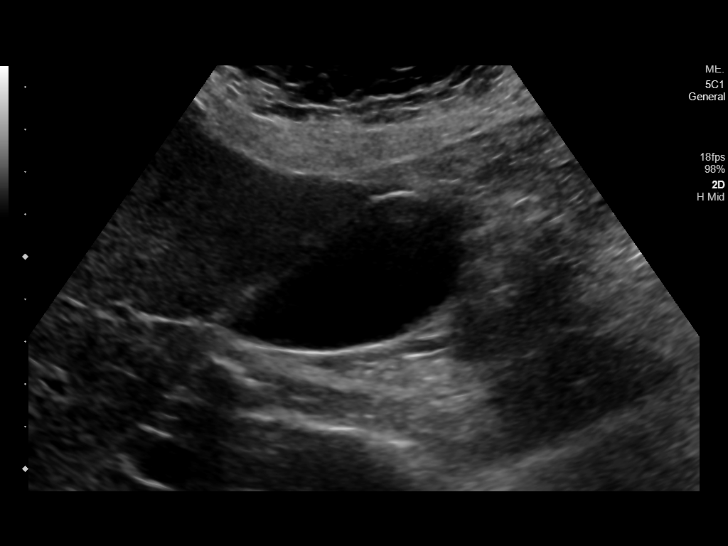
[im 23/61]
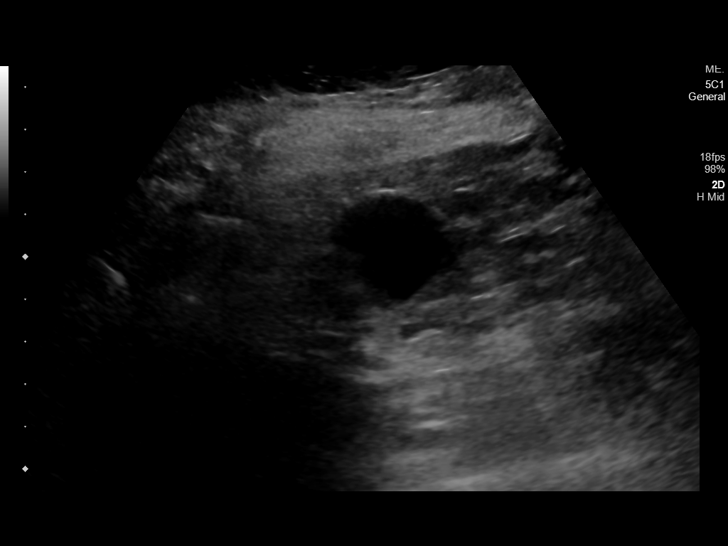
[im 28/61]
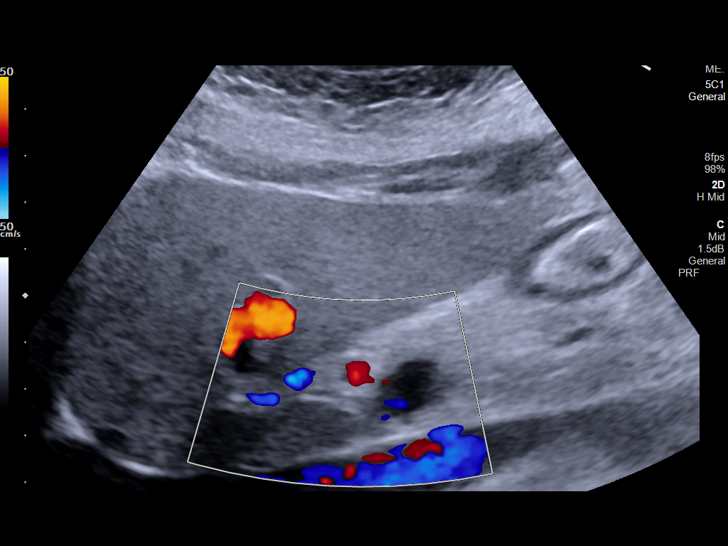
[im 33/61]
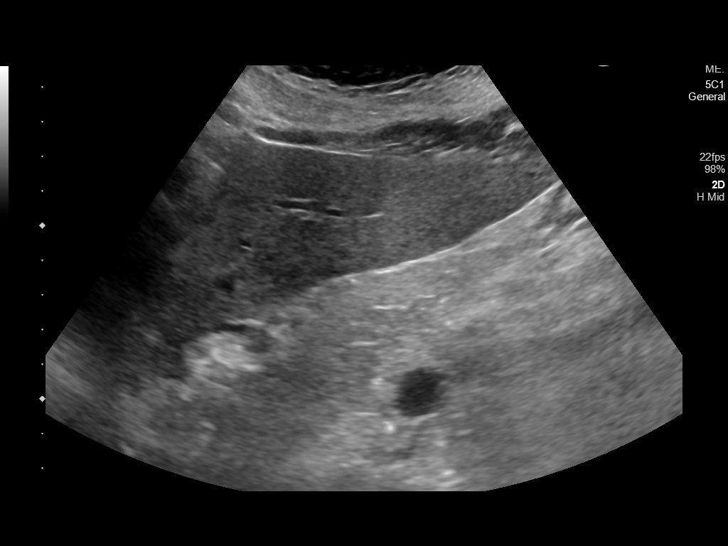
[im 38/61]
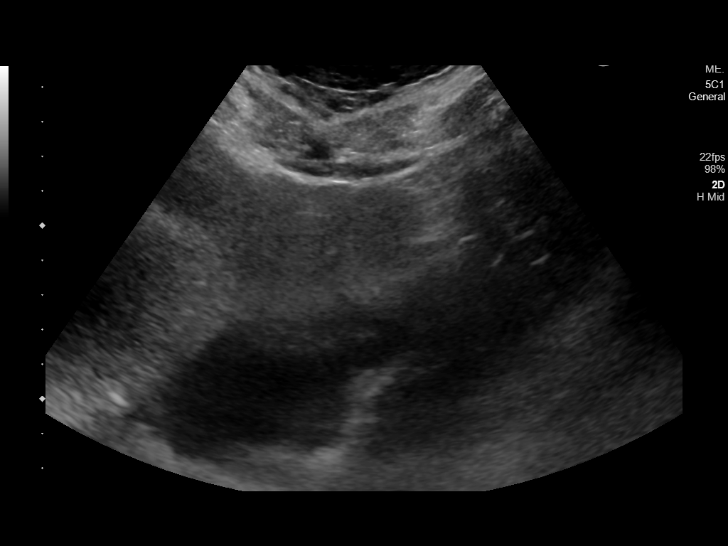
[im 41/61]
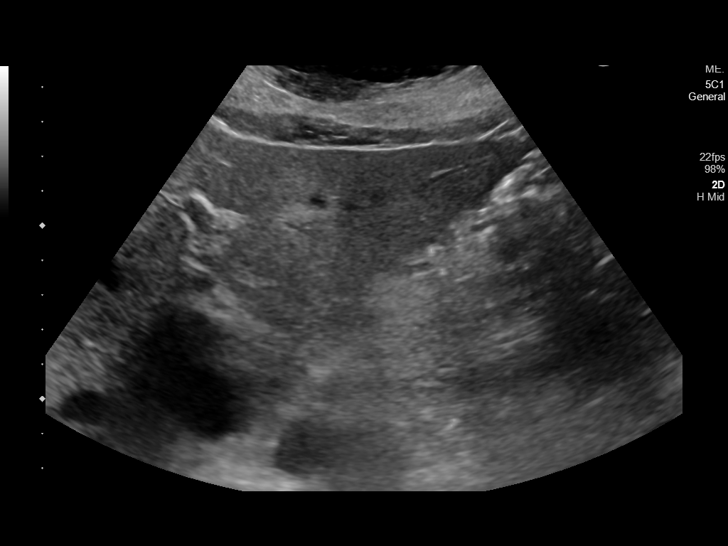
[im 46/61]
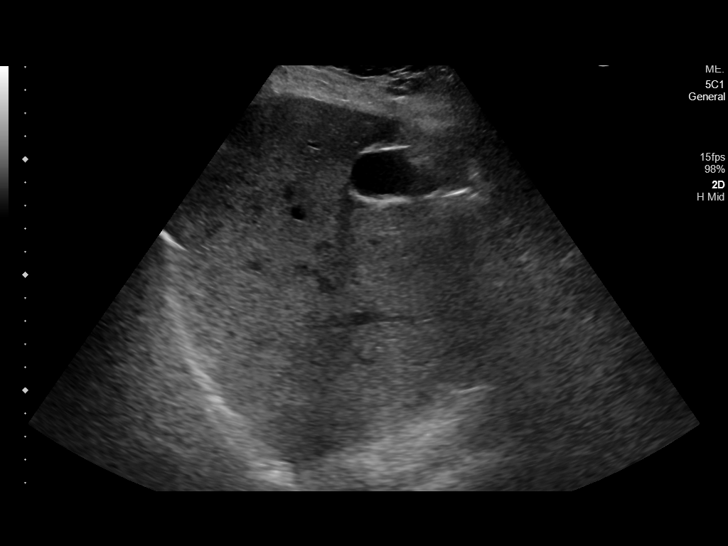
[im 51/61]
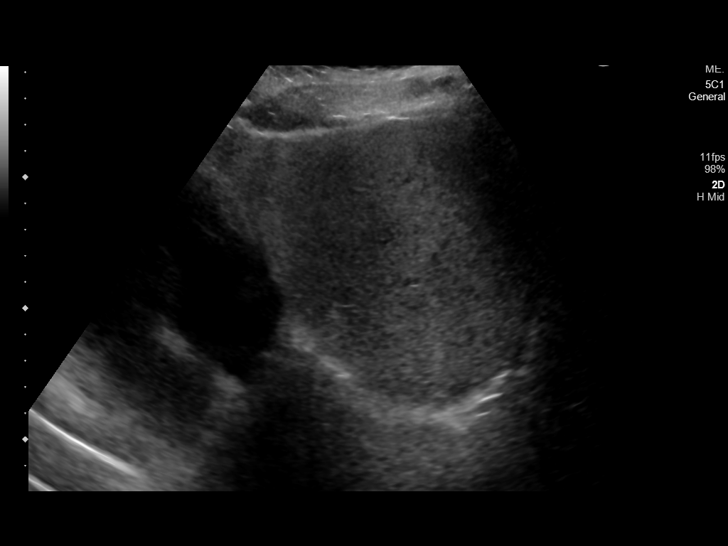
[im 56/61]
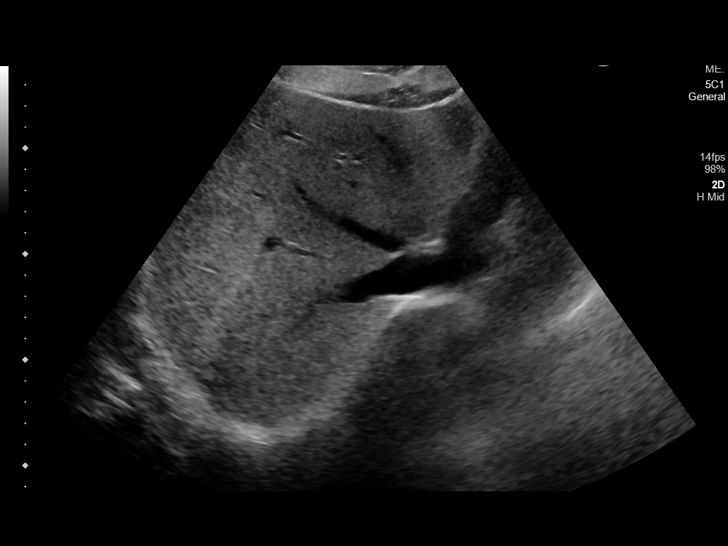
[im 61/61]
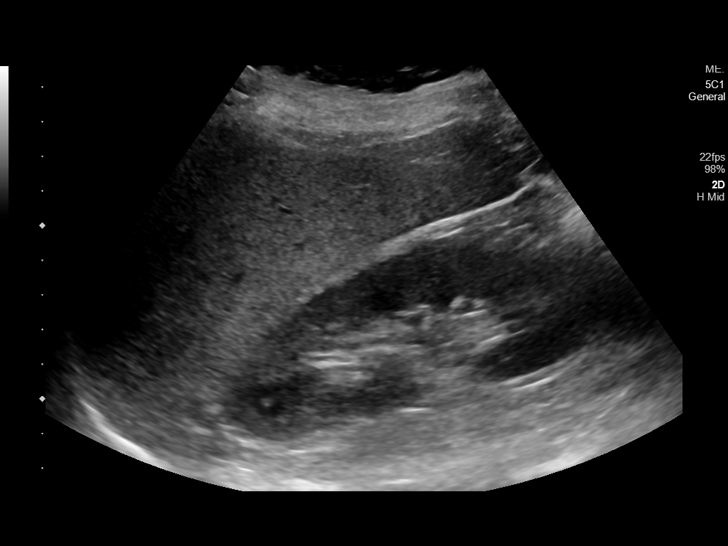

[14 of 25 positions shown; findings below may reference images not displayed]

FINDINGS: Gallbladder:

No gallstones or wall thickening visualized. No sonographic Murphy
sign noted by sonographer.

Common bile duct:

Diameter: 3 mm

Liver:

No focal lesion identified. Borderline elevated parenchymal
echogenicity. Portal vein is patent on color Doppler imaging with
normal direction of blood flow towards the liver.
IMPRESSION: No acute finding.  Normal gallbladder.

## 2022-08-05 ENCOUNTER — Ambulatory Visit: Payer: Self-pay | Admitting: Nurse Practitioner
